# Patient Record
Sex: Male | Born: 1965 | Race: Black or African American | Hispanic: No | Marital: Married | Smoking: Former smoker
Health system: Southern US, Community
[De-identification: ages and names within clinical notes are randomized; demographics above are authoritative.]

## PROBLEM LIST (undated history)

## (undated) DIAGNOSIS — I10 Essential (primary) hypertension: Secondary | ICD-10-CM

## (undated) DIAGNOSIS — F419 Anxiety disorder, unspecified: Secondary | ICD-10-CM

## (undated) DIAGNOSIS — D75839 Thrombocytosis, unspecified: Secondary | ICD-10-CM

## (undated) DIAGNOSIS — E785 Hyperlipidemia, unspecified: Secondary | ICD-10-CM

## (undated) DIAGNOSIS — D473 Essential (hemorrhagic) thrombocythemia: Secondary | ICD-10-CM

## (undated) HISTORY — DX: Thrombocytosis, unspecified: D75.839

## (undated) HISTORY — DX: Hyperlipidemia, unspecified: E78.5

## (undated) HISTORY — DX: Essential (hemorrhagic) thrombocythemia: D47.3

## (undated) HISTORY — DX: Anxiety disorder, unspecified: F41.9

---

## 2011-09-15 ENCOUNTER — Other Ambulatory Visit: Payer: Self-pay | Admitting: Internal Medicine

## 2016-03-13 ENCOUNTER — Emergency Department (HOSPITAL_COMMUNITY): Payer: Worker's Compensation

## 2016-03-13 ENCOUNTER — Emergency Department (HOSPITAL_COMMUNITY)
Admission: EM | Admit: 2016-03-13 | Discharge: 2016-03-13 | Disposition: A | Discharge status: Home / Self Care | Payer: Worker's Compensation | Attending: Emergency Medicine | Admitting: Emergency Medicine

## 2016-03-13 ENCOUNTER — Encounter (HOSPITAL_COMMUNITY): Payer: Self-pay

## 2016-03-13 DIAGNOSIS — Z23 Encounter for immunization: Secondary | ICD-10-CM | POA: Diagnosis not present

## 2016-03-13 DIAGNOSIS — Y999 Unspecified external cause status: Secondary | ICD-10-CM | POA: Diagnosis not present

## 2016-03-13 DIAGNOSIS — Y92410 Unspecified street and highway as the place of occurrence of the external cause: Secondary | ICD-10-CM | POA: Insufficient documentation

## 2016-03-13 DIAGNOSIS — Y939 Activity, unspecified: Secondary | ICD-10-CM | POA: Insufficient documentation

## 2016-03-13 DIAGNOSIS — S0181XA Laceration without foreign body of other part of head, initial encounter: Secondary | ICD-10-CM | POA: Insufficient documentation

## 2016-03-13 DIAGNOSIS — R0781 Pleurodynia: Secondary | ICD-10-CM | POA: Diagnosis not present

## 2016-03-13 DIAGNOSIS — R109 Unspecified abdominal pain: Secondary | ICD-10-CM | POA: Diagnosis not present

## 2016-03-13 DIAGNOSIS — S0990XA Unspecified injury of head, initial encounter: Secondary | ICD-10-CM | POA: Diagnosis present

## 2016-03-13 DIAGNOSIS — S20312A Abrasion of left front wall of thorax, initial encounter: Secondary | ICD-10-CM | POA: Diagnosis not present

## 2016-03-13 DIAGNOSIS — S60222A Contusion of left hand, initial encounter: Secondary | ICD-10-CM | POA: Diagnosis not present

## 2016-03-13 DIAGNOSIS — S5002XA Contusion of left elbow, initial encounter: Secondary | ICD-10-CM | POA: Insufficient documentation

## 2016-03-13 DIAGNOSIS — R1012 Left upper quadrant pain: Secondary | ICD-10-CM

## 2016-03-13 DIAGNOSIS — T07XXXA Unspecified multiple injuries, initial encounter: Secondary | ICD-10-CM

## 2016-03-13 DIAGNOSIS — S30811A Abrasion of abdominal wall, initial encounter: Secondary | ICD-10-CM | POA: Diagnosis not present

## 2016-03-13 DIAGNOSIS — T1490XA Injury, unspecified, initial encounter: Secondary | ICD-10-CM

## 2016-03-13 DIAGNOSIS — I10 Essential (primary) hypertension: Secondary | ICD-10-CM | POA: Insufficient documentation

## 2016-03-13 HISTORY — DX: Essential (primary) hypertension: I10

## 2016-03-13 LAB — CBC WITH DIFFERENTIAL/PLATELET
Basophils Absolute: 0 10*3/uL (ref 0.0–0.1)
Basophils Relative: 0 %
Eosinophils Absolute: 0.2 10*3/uL (ref 0.0–0.7)
Eosinophils Relative: 2 %
HCT: 42.5 % (ref 39.0–52.0)
Hemoglobin: 13.2 g/dL (ref 13.0–17.0)
Lymphocytes Relative: 21 %
Lymphs Abs: 1.9 10*3/uL (ref 0.7–4.0)
MCH: 25.5 pg — ABNORMAL LOW (ref 26.0–34.0)
MCHC: 31.1 g/dL (ref 30.0–36.0)
MCV: 82.2 fL (ref 78.0–100.0)
Monocytes Absolute: 0.8 10*3/uL (ref 0.1–1.0)
Monocytes Relative: 9 %
Neutro Abs: 5.8 10*3/uL (ref 1.7–7.7)
Neutrophils Relative %: 68 %
Platelets: 812 10*3/uL — ABNORMAL HIGH (ref 150–400)
RBC: 5.17 MIL/uL (ref 4.22–5.81)
RDW: 15.3 % (ref 11.5–15.5)
WBC: 8.7 10*3/uL (ref 4.0–10.5)

## 2016-03-13 LAB — BASIC METABOLIC PANEL
Anion gap: 6 (ref 5–15)
BUN: 11 mg/dL (ref 6–20)
CO2: 27 mmol/L (ref 22–32)
Calcium: 8.7 mg/dL — ABNORMAL LOW (ref 8.9–10.3)
Chloride: 104 mmol/L (ref 101–111)
Creatinine, Ser: 1.21 mg/dL (ref 0.61–1.24)
GFR calc Af Amer: >60 mL/min (ref >60)
GFR calc non Af Amer: >60 mL/min (ref >60)
Glucose, Bld: 97 mg/dL (ref 65–99)
Potassium: 3.9 mmol/L (ref 3.5–5.1)
Sodium: 137 mmol/L (ref 135–145)

## 2016-03-13 MED ORDER — FLUORESCEIN SODIUM 1 MG OP STRP
1.0000 | ORAL_STRIP | Freq: Once | OPHTHALMIC | Status: AC
  Administered 2016-03-13: 1 via OPHTHALMIC
  Filled 2016-03-13: qty 1

## 2016-03-13 MED ORDER — CYCLOBENZAPRINE HCL 10 MG PO TABS: 10.0000 mg | ORAL_TABLET | Freq: Three times a day (TID) | ORAL | 0 refills | Status: DC | PRN

## 2016-03-13 MED ORDER — LIDOCAINE-EPINEPHRINE (PF) 2 %-1:200000 IJ SOLN
20.0000 mL | Freq: Once | INTRAMUSCULAR | Status: AC
  Administered 2016-03-13: 20 mL
  Filled 2016-03-13: qty 20

## 2016-03-13 MED ORDER — IOPAMIDOL (ISOVUE-300) INJECTION 61%
INTRAVENOUS | Status: AC
  Administered 2016-03-13: 100 mL
  Filled 2016-03-13: qty 75

## 2016-03-13 MED ORDER — IBUPROFEN 800 MG PO TABS: 800.0000 mg | ORAL_TABLET | Freq: Three times a day (TID) | ORAL | 0 refills | Status: DC | PRN

## 2016-03-13 MED ORDER — IOPAMIDOL (ISOVUE-300) INJECTION 61%
INTRAVENOUS | Status: AC
  Filled 2016-03-13: qty 50

## 2016-03-13 MED ORDER — TETRACAINE HCL 0.5 % OP SOLN
2.0000 [drp] | Freq: Once | OPHTHALMIC | Status: AC
  Administered 2016-03-13: 2 [drp] via OPHTHALMIC
  Filled 2016-03-13: qty 2

## 2016-03-13 MED ORDER — HYDROCODONE-ACETAMINOPHEN 5-325 MG PO TABS: 1.0000 | ORAL_TABLET | ORAL | 0 refills | Status: DC | PRN

## 2016-03-13 MED ORDER — TETANUS-DIPHTH-ACELL PERTUSSIS 5-2.5-18.5 LF-MCG/0.5 IM SUSP
0.5000 mL | Freq: Once | INTRAMUSCULAR | Status: AC
  Administered 2016-03-13: 0.5 mL via INTRAMUSCULAR
  Filled 2016-03-13: qty 0.5

## 2016-03-13 MED ORDER — BACITRACIN ZINC 500 UNIT/GM EX OINT: 1.0000 "application " | TOPICAL_OINTMENT | Freq: Two times a day (BID) | CUTANEOUS | 0 refills | Status: DC

## 2016-03-13 MED ORDER — BACITRACIN ZINC 500 UNIT/GM EX OINT: TOPICAL_OINTMENT | Freq: Two times a day (BID) | CUTANEOUS | Status: DC

## 2016-03-13 MED ORDER — HYDROMORPHONE HCL 1 MG/ML IJ SOLN
1.0000 mg | Freq: Once | INTRAMUSCULAR | Status: AC
  Administered 2016-03-13: 1 mg via INTRAVENOUS
  Filled 2016-03-13: qty 1

## 2016-03-13 NOTE — Discharge Instructions (Signed)
Read the information below.  Use the prescribed medication as directed.  Please discuss all new medications with your pharmacist.  Do not take additional tylenol while taking the prescribed pain medication to avoid overdose.  You may return to the Emergency Department at any time for worsening condition or any new symptoms that concern you.    If you develop redness, swelling, pus draining from the wound, or fevers greater than 100.4, return to the ER immediately for a recheck.     You have had a head injury which does not appear to require admission at this time. A concussion is a state of changed mental ability from trauma. SEEK IMMEDIATE MEDICAL ATTENTION IF: There is confusion or drowsiness (although children frequently become drowsy after injury).  You cannot awaken the injured person.  There is nausea (feeling sick to your stomach) or continued, forceful vomiting.  You notice dizziness or unsteadiness which is getting worse, or inability to walk.  You have convulsions or unconsciousness.  You experience severe, persistent headaches not relieved by Tylenol?. (Do not take aspirin as this impairs clotting abilities). Take other pain medications only as directed.  You cannot use arms or legs normally.  There are changes in pupil sizes. (This is the black center in the colored part of the eye)  There is clear or bloody discharge from the nose or ears.  Change in speech, vision, swallowing, or understanding.  Localized weakness, numbness, tingling, or change in bowel or bladder control.
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
## 2016-03-17 ENCOUNTER — Encounter: Payer: Self-pay | Admitting: Hematology and Oncology

## 2016-03-17 ENCOUNTER — Telehealth: Payer: Self-pay | Admitting: Hematology and Oncology

## 2016-03-17 NOTE — Telephone Encounter (Signed)
Telephone call from Ethelene Browns from Dr. Lina Sar office.  Appointment scheduled with [facility] on 8/17 at 345pm. Letter mailed to the patient.

## 2016-03-30 ENCOUNTER — Encounter: Payer: Self-pay | Admitting: Hematology and Oncology

## 2016-03-30 ENCOUNTER — Ambulatory Visit (HOSPITAL_BASED_OUTPATIENT_CLINIC_OR_DEPARTMENT_OTHER): Payer: 59 | Admitting: Hematology and Oncology

## 2016-03-30 ENCOUNTER — Telehealth: Payer: Self-pay | Admitting: Hematology and Oncology

## 2016-03-30 ENCOUNTER — Ambulatory Visit (HOSPITAL_BASED_OUTPATIENT_CLINIC_OR_DEPARTMENT_OTHER): Payer: 59

## 2016-03-30 VITALS — BP 149/103 | HR 55 | Temp 98.4°F | Resp 19 | Wt 228.7 lb

## 2016-03-30 DIAGNOSIS — D75839 Thrombocytosis, unspecified: Secondary | ICD-10-CM

## 2016-03-30 DIAGNOSIS — Z1589 Genetic susceptibility to other disease: Secondary | ICD-10-CM | POA: Insufficient documentation

## 2016-03-30 DIAGNOSIS — D473 Essential (hemorrhagic) thrombocythemia: Secondary | ICD-10-CM

## 2016-03-30 LAB — CBC WITH DIFFERENTIAL/PLATELET
BASO%: 1 % (ref 0.0–2.0)
Basophils Absolute: 0.1 10*3/uL (ref 0.0–0.1)
EOS%: 2.1 % (ref 0.0–7.0)
Eosinophils Absolute: 0.1 10*3/uL (ref 0.0–0.5)
HCT: 46.7 % (ref 38.4–49.9)
HGB: 14.9 g/dL (ref 13.0–17.1)
LYMPH%: 36.3 % (ref 14.0–49.0)
MCH: 25.6 pg — ABNORMAL LOW (ref 27.2–33.4)
MCHC: 32 g/dL (ref 32.0–36.0)
MCV: 80.1 fL (ref 79.3–98.0)
MONO#: 0.6 10*3/uL (ref 0.1–0.9)
MONO%: 8.9 % (ref 0.0–14.0)
NEUT#: 3.7 10*3/uL (ref 1.5–6.5)
NEUT%: 51.7 % (ref 39.0–75.0)
Platelets: 958 10*3/uL — ABNORMAL HIGH (ref 140–400)
RBC: 5.82 10*6/uL (ref 4.20–5.82)
RDW: 15.8 % — ABNORMAL HIGH (ref 11.0–14.6)
WBC: 7.2 10*3/uL (ref 4.0–10.3)
lymph#: 2.6 10*3/uL (ref 0.9–3.3)

## 2016-03-30 LAB — FERRITIN: Ferritin: 108 ng/ml (ref 22–316)

## 2016-03-30 LAB — IRON AND TIBC
%SAT: 54 % (ref 20–55)
Iron: 146 ug/dL (ref 42–163)
TIBC: 270 ug/dL (ref 202–409)
UIBC: 125 ug/dL (ref 117–376)

## 2016-03-30 NOTE — Telephone Encounter (Signed)
appt made and avs printed °
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
## 2016-03-30 NOTE — Assessment & Plan Note (Signed)
Platelet counts 810 K to 950K  Differential diagnosis 1. Primary thrombocytosis: Related to myeloproliferative disorders of the bone marrow especially essential thrombocytosis and CML. I would like to send for BCR-ABL as well as JAK-2 dictation testings. Patient understands that JAK2 mutation is only present in 50% of essential thrombocytosis so the test is advantageous only if it is positive. If it is negative, it does not rule out. 2. Secondary/reactive thrombocytosis Different causes including infections, inflammation, iron deficiency.  I would like to send out for C-reactive protein, iron studies with ferritin to complete the workup.  Treatment options: 1. If it is primary essential thrombocytosis, treatment would depend on platelet count level as well as history of thrombosis. A. For low risk patients, (platelet counts less than 1000 and no history of blood clots) the treatment would be with aspirin therapy B. for high risk patients(platelet counts greater than 1000/history of blood clot) the treatment would be platelet lowering therapy with aspirin 2. Treatment of secondary thrombocytosis would be to treat underlying cause. There would not be any risk of thrombosis with secondary thrombocytosis.  Return to clinic in 2 weeks to discuss the results of these tests.

## 2016-03-31 LAB — C-REACTIVE PROTEIN: CRP: 0.8 mg/L (ref 0.0–4.9)

## 2016-04-13 ENCOUNTER — Ambulatory Visit: Payer: 59 | Admitting: Hematology and Oncology

## 2016-04-13 NOTE — Assessment & Plan Note (Deleted)
Platelet counts 810 K to 950K  Differential diagnosis 1. Primary thrombocytosis: Related to myeloproliferative disorders of the bone marrow especially essential thrombocytosis and CML. 2. Secondary/reactive thrombocytosis  Lab work review: From 03/30/2016 1. CRP 0.8 normal 2. platelet count 958 3. Iron studies show ferritin of 108 and iron saturation of 54% 4. JAK-2 Mutation 5. BCR-ABL:   Recommendation: 1. Aspirin 2. platelet lowering therapy with anagrelide  Return to clinic in 2 months to check the response to treatment. Our goal is to decrease the platelet count to <450 K

## 2016-06-20 ENCOUNTER — Encounter (HOSPITAL_COMMUNITY): Payer: Self-pay

## 2016-06-20 ENCOUNTER — Emergency Department (HOSPITAL_COMMUNITY)
Admission: EM | Admit: 2016-06-20 | Discharge: 2016-06-20 | Disposition: A | Discharge status: Home / Self Care | Payer: 59 | Attending: Dermatology | Admitting: Dermatology

## 2016-06-20 DIAGNOSIS — M79644 Pain in right finger(s): Secondary | ICD-10-CM | POA: Insufficient documentation

## 2016-06-20 DIAGNOSIS — I10 Essential (primary) hypertension: Secondary | ICD-10-CM | POA: Diagnosis not present

## 2016-06-20 DIAGNOSIS — Z5321 Procedure and treatment not carried out due to patient leaving prior to being seen by health care provider: Secondary | ICD-10-CM | POA: Insufficient documentation

## 2016-06-20 NOTE — ED Triage Notes (Signed)
Pt complaining of R 2nd finger pain. Pt states new onset darkening of tip of finger. Pt complaining of throbbing pain. Pt denies any injury/trauma.

## 2016-06-20 NOTE — ED Notes (Signed)
Pt states does not want to wait. Pt states wants to leave. RN encouraged pt to return if worsening symptoms.

## 2016-10-19 ENCOUNTER — Ambulatory Visit: Payer: Self-pay | Admitting: Adult Health

## 2016-11-24 ENCOUNTER — Ambulatory Visit (INDEPENDENT_AMBULATORY_CARE_PROVIDER_SITE_OTHER): Payer: Self-pay | Admitting: Urgent Care

## 2016-11-24 VITALS — BP 150/94 | HR 86 | Temp 97.6°F | Resp 18 | Ht 74.5 in | Wt 229.0 lb

## 2016-11-24 DIAGNOSIS — Z024 Encounter for examination for driving license: Secondary | ICD-10-CM

## 2016-11-24 DIAGNOSIS — I10 Essential (primary) hypertension: Secondary | ICD-10-CM

## 2016-11-24 DIAGNOSIS — R030 Elevated blood-pressure reading, without diagnosis of hypertension: Secondary | ICD-10-CM

## 2016-11-24 NOTE — Progress Notes (Signed)
  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 The patient reports history of HTN, managed with lis-HCTZ. Has a history of hand infection from fishing, is w/o sequelae. Denies dizziness, chronic headache, blurred vision, chest pain, shortness of breath, heart racing, palpitations, nausea, vomiting, abdominal pain, hematuria, lower leg swelling. Denies smoking cigarettes or drinking alcohol.   The following portions of the patient's history were reviewed and updated as appropriate: allergies, current medications, past family history, past medical history, past social history and past surgical history.  Objective:   BP (!) 150/94 (BP Location: Left Arm, Patient Position: Sitting, Cuff Size: Normal)   Pulse 86   Temp 97.6 F (36.4 C) (Oral)   Resp 18   Ht 6' 2.5" (1.892 m)   Wt 229 lb (103.9 kg)   SpO2 97%   BMI 29.01 kg/m   Vision/hearing:  Visual Acuity Screening   Right eye Left eye Both eyes  Without correction: 20/20 20/20 20/20   With correction:     Hearing Screening Comments: Peripheral Vision: Right eye 85 degrees. Left eye 85 degrees. The patient can distinguish the colors red, amber and green. The patient was able to hear a forced whisper from L=10 R=10 feet.  Patient can recognize and distinguish among traffic control signals and devices showing standard red, green, and amber colors.  Corrective lenses required: No  Monocular Vision?: No  Hearing aid requirement: No  Physical Exam  Constitutional: He is oriented to person, place, and time. He appears well-developed and well-nourished.  HENT:  TM's intact bilaterally, no effusions or erythema. Nasal turbinates pink and moist, nasal passages patent. No sinus tenderness. Oropharynx clear, mucous membranes moist, dentition in good repair.  Eyes: Conjunctivae and EOM are normal. Pupils are equal, round, and reactive to light. Right eye exhibits no  discharge. Left eye exhibits no discharge. No scleral icterus.  Neck: Normal range of motion. Neck supple.  Cardiovascular: Normal rate, regular rhythm and intact distal pulses.  Exam reveals no gallop and no friction rub.   No murmur heard. Pulmonary/Chest: No stridor. No respiratory distress. He has no wheezes. He has no rales.  Abdominal: Soft. Bowel sounds are normal. He exhibits no distension and no mass. There is no tenderness.  Musculoskeletal: Normal range of motion. He exhibits no edema or tenderness.  Lymphadenopathy:    He has no cervical adenopathy.  Neurological: He is alert and oriented to person, place, and time. He has normal reflexes. He displays normal reflexes. Coordination normal.  Skin: Skin is warm and dry. Capillary refill takes less than 2 seconds. No rash noted. No erythema. No pallor.  Psychiatric: He has a normal mood and affect.   Labs: Comments: spgr:1.020 Glu:neg, Pro:neg, Blood:neg   Assessment:    Healthy male exam.  Meets standards, but periodic monitoring required due to HTN.  Driver qualified only for 3 months.    Plan:   Need follow-up in 3 months for recheck of HTN. Return as needed.  Jaynee Eagles, PA-C [facility] [phone] 11/24/2016  2:59 PM

## 2016-11-24 NOTE — Patient Instructions (Signed)
     IF you received an x-ray today, you will receive an invoice from [facility]. Please contact [facility] at [phone] with questions or concerns regarding your invoice.   IF you received labwork today, you will receive an invoice from [facility]. Please contact [facility] at [phone] with questions or concerns regarding your invoice.   Our billing staff will not be able to assist you with questions regarding bills from these companies.  You will be contacted with the lab results as soon as they are available. The fastest way to get your results is to activate your My Chart account. Instructions are located on the last page of this paperwork. If you have not heard from us regarding the results in 2 weeks, please contact this office.     

## 2016-12-06 ENCOUNTER — Ambulatory Visit (INDEPENDENT_AMBULATORY_CARE_PROVIDER_SITE_OTHER): Payer: 59 | Admitting: Emergency Medicine

## 2016-12-06 VITALS — BP 144/78 | HR 68 | Temp 97.8°F | Resp 18 | Ht 74.0 in | Wt 234.0 lb

## 2016-12-06 DIAGNOSIS — D473 Essential (hemorrhagic) thrombocythemia: Secondary | ICD-10-CM

## 2016-12-06 DIAGNOSIS — Z0000 Encounter for general adult medical examination without abnormal findings: Secondary | ICD-10-CM

## 2016-12-06 DIAGNOSIS — I10 Essential (primary) hypertension: Secondary | ICD-10-CM | POA: Insufficient documentation

## 2016-12-06 MED ORDER — LISINOPRIL-HYDROCHLOROTHIAZIDE 10-12.5 MG PO TABS: ORAL_TABLET | ORAL | 3 refills | Status: DC

## 2016-12-06 NOTE — Progress Notes (Signed)
Stephen Patrick 51 y.o.   Chief Complaint  Patient presents with  . Annual Exam  . Medication Refill    Lisinopril HCTZ    HISTORY OF PRESENT ILLNESS: This is a 51 y.o. male here for annual exam; needs med refill for HTN.  HPI   Prior to Admission medications   Medication Sig Start Date End Date Taking? Authorizing Provider  lisinopril-hydrochlorothiazide (PRINZIDE,ZESTORETIC) 10-12.5 MG per tablet take 1 tablet by mouth once daily ; NEEDS OFFICE VISIT 09/15/11  Yes Rise Mu, PA-C    No Known Allergies  Patient Active Problem List   Diagnosis Date Noted  . Thrombocytosis ([place]) 03/30/2016    Past Medical History:  Diagnosis Date  . Hypertension     No past surgical history on file.  Social History   Social History  . Marital status: Married    Spouse name: N/A  . Number of children: N/A  . Years of education: N/A   Occupational History  . Not on file.   Social History Main Topics  . Smoking status: Never Smoker  . Smokeless tobacco: Never Used  . Alcohol use No  . Drug use: No  . Sexual activity: No   Other Topics Concern  . Not on file   Social History Narrative  . No narrative on file    Family History  Problem Relation Age of Onset  . Cancer Father      Review of Systems  Constitutional: Negative.  Negative for chills, fever and weight loss.  HENT: Negative.  Negative for congestion, nosebleeds and sore throat.   Eyes: Negative.  Negative for blurred vision, double vision, discharge and redness.  Respiratory: Negative.  Negative for cough, shortness of breath and wheezing.   Cardiovascular: Negative.  Negative for chest pain, palpitations, claudication, leg swelling and PND.  Gastrointestinal: Negative.  Negative for abdominal pain, blood in stool, diarrhea, nausea and vomiting.  Genitourinary: Negative.  Negative for dysuria and hematuria.  Musculoskeletal: Negative.  Negative for back pain and joint pain.  Skin: Negative.  Negative for  rash.  Neurological: Negative.  Negative for dizziness, sensory change, focal weakness, seizures, loss of consciousness and headaches.  Endo/Heme/Allergies: Negative.  Does not bruise/bleed easily.  All other systems reviewed and are negative.  Vitals:   12/06/16 0824 12/06/16 0834  BP: (!) 162/92 (!) 144/78  Pulse:    Resp:    Temp:       Physical Exam  Constitutional: He is oriented to person, place, and time. He appears well-developed and well-nourished.  HENT:  Head: Normocephalic and atraumatic.  Nose: Nose normal.  Mouth/Throat: Oropharynx is clear and moist. No oropharyngeal exudate.  Eyes: Conjunctivae and EOM are normal. Pupils are equal, round, and reactive to light.  Neck: Normal range of motion. Neck supple. No JVD present. No thyromegaly present.  Cardiovascular: Normal rate, regular rhythm, normal heart sounds and intact distal pulses.   Pulmonary/Chest: Effort normal and breath sounds normal.  Abdominal: Soft. Bowel sounds are normal. He exhibits no distension. There is no tenderness.  Musculoskeletal: Normal range of motion.  Lymphadenopathy:    He has no cervical adenopathy.  Neurological: He is alert and oriented to person, place, and time. No sensory deficit. He exhibits normal muscle tone.  Skin: Skin is warm and dry. Capillary refill takes less than 2 seconds. No rash noted.  Psychiatric: He has a normal mood and affect. His behavior is normal.  Vitals reviewed.    ASSESSMENT & PLAN: Stephen Patrick was  seen today for annual exam and medication refill.  Diagnoses and all orders for this visit:  Routine general medical examination at a health care facility -     CBC with Differential -     Comprehensive metabolic panel -     Hemoglobin A1c -     Lipid panel -     PSA(Must document that pt has been informed of limitations of PSA testing.) -     TSH -     Hepatitis C antibody screen -     HIV antibody  Essential hypertension  Primary thrombocytosis  (HCC)  Other orders -     Discontinue: lisinopril-hydrochlorothiazide (PRINZIDE,ZESTORETIC) 10-12.5 MG tablet; take 1 tablet by mouth once daily -     lisinopril-hydrochlorothiazide (PRINZIDE,ZESTORETIC) 10-12.5 MG tablet; take 1 tablet by mouth once daily    Patient Instructions       IF you received an x-ray today, you will receive an invoice from [facility]. Please contact [facility] at [phone] with questions or concerns regarding your invoice.   IF you received labwork today, you will receive an invoice from [place]. Please contact [facility] at [phone] with questions or concerns regarding your invoice.   Our billing staff will not be able to assist you with questions regarding bills from these companies.  You will be contacted with the lab results as soon as they are available. The fastest way to get your results is to activate your My Chart account. Instructions are located on the last page of this paperwork. If you have not heard from [place] regarding the results in 2 weeks, please contact this office.        Health Maintenance, Male A healthy lifestyle and preventive care is important for your health and wellness. Ask your health care provider about what schedule of regular examinations is right for you. What should I know about weight and diet?  Eat a Healthy Diet  Eat plenty of vegetables, fruits, whole grains, low-fat dairy products, and lean protein.  Do not eat a lot of foods high in solid fats, added sugars, or salt. Maintain a Healthy Weight  Regular exercise can help you achieve or maintain a healthy weight. You should:  Do at least 150 minutes of exercise each week. The exercise should increase your heart rate and make you sweat (moderate-intensity exercise).  Do strength-training exercises at least twice a week. Watch Your Levels of Cholesterol and Blood Lipids  Have your blood tested for lipids and cholesterol every 5 years  starting at 53 years of age. If you are at high risk for heart disease, you should start having your blood tested when you are 53 years old. You may need to have your cholesterol levels checked more often if:  Your lipid or cholesterol levels are high.  You are older than 53 years of age.  You are at high risk for heart disease. What should I know about cancer screening? Many types of cancers can be detected early and may often be prevented. Lung Cancer  You should be screened every year for lung cancer if:  You are a current smoker who has smoked for at least 30 years.  You are a former smoker who has quit within the past 15 years.  Talk to your health care provider about your screening options, when you should start screening, and how often you should be screened. Colorectal Cancer  Routine colorectal cancer screening usually begins at 53 years of age and should be repeated  every 5-10 years until you are 53 years old. You may need to be screened more often if early forms of precancerous polyps or small growths are found. Your health care provider may recommend screening at an earlier age if you have risk factors for colon cancer.  Your health care provider may recommend using home test kits to check for hidden blood in the stool.  A small camera at the end of a tube can be used to examine your colon (sigmoidoscopy or colonoscopy). This checks for the earliest forms of colorectal cancer. Prostate and Testicular Cancer  Depending on your age and overall health, your health care provider may do certain tests to screen for prostate and testicular cancer.  Talk to your health care provider about any symptoms or concerns you have about testicular or prostate cancer. Skin Cancer  Check your skin from head to toe regularly.  Tell your health care provider about any new moles or changes in moles, especially if:  There is a change in a mole's size, shape, or color.  You have a mole that  is larger than a pencil eraser.  Always use sunscreen. Apply sunscreen liberally and repeat throughout the day.  Protect yourself by wearing long sleeves, pants, a wide-brimmed hat, and sunglasses when outside. What should I know about heart disease, diabetes, and high blood pressure?  If you are 40-28 years of age, have your blood pressure checked every 3-5 years. If you are 19 years of age or older, have your blood pressure checked every year. You should have your blood pressure measured twice-once when you are at a hospital or clinic, and once when you are not at a hospital or clinic. Record the average of the two measurements. To check your blood pressure when you are not at a hospital or clinic, you can use:  An automated blood pressure machine at a pharmacy.  A home blood pressure monitor.  Talk to your health care provider about your target blood pressure.  If you are between 58-65 years old, ask your health care provider if you should take aspirin to prevent heart disease.  Have regular diabetes screenings by checking your fasting blood sugar level.  If you are at a normal weight and have a low risk for diabetes, have this test once every three years after the age of 42.  If you are overweight and have a high risk for diabetes, consider being tested at a younger age or more often.  A one-time screening for abdominal aortic aneurysm (AAA) by ultrasound is recommended for men aged 98-75 years who are current or former smokers. What should I know about preventing infection? Hepatitis B  If you have a higher risk for hepatitis B, you should be screened for this virus. Talk with your health care provider to find out if you are at risk for hepatitis B infection. Hepatitis C  Blood testing is recommended for:  Everyone born from [birth year] through 1967.  Anyone with known risk factors for hepatitis C. Sexually Transmitted Diseases (STDs)  You should be screened each year for STDs  including gonorrhea and chlamydia if:  You are sexually active and are younger than 53 years of age.  You are older than 53 years of age and your health care provider tells you that you are at risk for this type of infection.  Your sexual activity has changed since you were last screened and you are at an increased risk for chlamydia or gonorrhea. Ask your health  care provider if you are at risk.  Talk with your health care provider about whether you are at high risk of being infected with HIV. Your health care provider may recommend a prescription medicine to help prevent HIV infection. What else can I do?  Schedule regular health, dental, and eye exams.  Stay current with your vaccines (immunizations).  Do not use any tobacco products, such as cigarettes, chewing tobacco, and e-cigarettes. If you need help quitting, ask your health care provider.  Limit alcohol intake to no more than 2 drinks per day. One drink equals 12 ounces of beer, 5 ounces of wine, or 1 ounces of hard liquor.  Do not use street drugs.  Do not share needles.  Ask your health care provider for help if you need support or information about quitting drugs.  Tell your health care provider if you often feel depressed.  Tell your health care provider if you have ever been abused or do not feel safe at home. This information is not intended to replace advice given to you by your health care provider. Make sure you discuss any questions you have with your health care provider. Document Released: 01/27/2008 Document Revised: 03/29/2016 Document Reviewed: 05/04/2015 Elsevier Interactive Patient Education  2017 [facility]) Exercise Recommendation  Being physically active is important to prevent heart disease and stroke, the nation's No. 1and No. 5killers. To improve overall cardiovascular health, we suggest at least 150 minutes per week of moderate exercise or 75 minutes per week of  vigorous exercise (or a combination of moderate and vigorous activity). Thirty minutes a day, five times a week is an easy goal to remember. You will also experience benefits even if you divide your time into two or three segments of 10 to 15 minutes per day.  For people who would benefit from lowering their blood pressure or cholesterol, we recommend 40 minutes of aerobic exercise of moderate to vigorous intensity three to four times a week to lower the risk for heart attack and stroke.  Physical activity is anything that makes you move your body and burn calories.  This includes things like climbing stairs or playing sports. Aerobic exercises benefit your heart, and include walking, jogging, swimming or biking. Strength and stretching exercises are best for overall stamina and flexibility.  The simplest, positive change you can make to effectively improve your heart health is to start walking. It's enjoyable, free, easy, social and great exercise. A walking program is flexible and boasts high success rates because people can stick with it. It's easy for walking to become a regular and satisfying part of life.   For Overall Cardiovascular Health:  At least 30 minutes of moderate-intensity aerobic activity at least 5 days per week for a total of 150  OR   At least 25 minutes of vigorous aerobic activity at least 3 days per week for a total of 75 minutes; or a combination of moderate- and vigorous-intensity aerobic activity  AND   Moderate- to high-intensity muscle-strengthening activity at least 2 days per week for additional health benefits.  For Lowering Blood Pressure and Cholesterol  An average 40 minutes of moderate- to vigorous-intensity aerobic activity 3 or 4 times per week  What if I can't make it to the time goal? Something is always better than nothing! And everyone has to start somewhere. Even if you've been sedentary for years, today is the day you can begin to make healthy  changes in your  life. If you don't think you'll make it for 30 or 40 minutes, set a reachable goal for today. You can work up toward your overall goal by increasing your time as you get stronger. Don't let all-or-nothing thinking rob you of doing what you can every day.  Source:http://www.heart.org    Hypertension Hypertension is another name for high blood pressure. High blood pressure forces your heart to work harder to pump blood. This can cause problems over time. There are two numbers in a blood pressure reading. There is a top number (systolic) over a bottom number (diastolic). It is best to have a blood pressure below 120/80. Healthy choices can help lower your blood pressure. You may need medicine to help lower your blood pressure if:  Your blood pressure cannot be lowered with healthy choices.  Your blood pressure is higher than 130/80. Follow these instructions at home: Eating and drinking   If directed, follow the DASH eating plan. This diet includes:  Filling half of your plate at each meal with fruits and vegetables.  Filling one quarter of your plate at each meal with whole grains. Whole grains include whole wheat pasta, brown rice, and whole grain bread.  Eating or drinking low-fat dairy products, such as skim milk or low-fat yogurt.  Filling one quarter of your plate at each meal with low-fat (lean) proteins. Low-fat proteins include fish, skinless chicken, eggs, beans, and tofu.  Avoiding fatty meat, cured and processed meat, or chicken with skin.  Avoiding premade or processed food.  Eat less than 1,500 mg of salt (sodium) a day.  Limit alcohol use to no more than 1 drink a day for nonpregnant women and 2 drinks a day for men. One drink equals 12 oz of beer, 5 oz of wine, or 1 oz of hard liquor. Lifestyle   Work with your doctor to stay at a healthy weight or to lose weight. Ask your doctor what the best weight is for you.  Get at least 30 minutes of exercise  that causes your heart to beat faster (aerobic exercise) most days of the week. This may include walking, swimming, or biking.  Get at least 30 minutes of exercise that strengthens your muscles (resistance exercise) at least 3 days a week. This may include lifting weights or pilates.  Do not use any products that contain nicotine or tobacco. This includes cigarettes and e-cigarettes. If you need help quitting, ask your doctor.  Check your blood pressure at home as told by your doctor.  Keep all follow-up visits as told by your doctor. This is important. Medicines   Take over-the-counter and prescription medicines only as told by your doctor. Follow directions carefully.  Do not skip doses of blood pressure medicine. The medicine does not work as well if you skip doses. Skipping doses also puts you at risk for problems.  Ask your doctor about side effects or reactions to medicines that you should watch for. Contact a doctor if:  You think you are having a reaction to the medicine you are taking.  You have headaches that keep coming back (recurring).  You feel dizzy.  You have swelling in your ankles.  You have trouble with your vision. Get help right away if:  You get a very bad headache.  You start to feel confused.  You feel weak or numb.  You feel faint.  You get very bad pain in your:  Chest.  Belly (abdomen).  You throw up (vomit) more than once.  You have trouble breathing. Summary  Hypertension is another name for high blood pressure.  Making healthy choices can help lower blood pressure. If your blood pressure cannot be controlled with healthy choices, you may need to take medicine. This information is not intended to replace advice given to you by your health care provider. Make sure you discuss any questions you have with your health care provider. Document Released: 01/17/2008 Document Revised: 06/28/2016 Document Reviewed: 06/28/2016 Elsevier  Interactive Patient Education  2017 [employer]      Agustina Caroli, MD [facility]

## 2016-12-06 NOTE — Patient Instructions (Addendum)
   IF you received an x-ray today, you will receive an invoice from [facility]. Please contact [facility] at [phone] with questions or concerns regarding your invoice.   IF you received labwork today, you will receive an invoice from [facility]. Please contact [facility] at [phone] with questions or concerns regarding your invoice.   Our billing staff will not be able to assist you with questions regarding bills from these companies.  You will be contacted with the lab results as soon as they are available. The fastest way to get your results is to activate your My Chart account. Instructions are located on the last page of this paperwork. If you have not heard from us regarding the results in 2 weeks, please contact this office.        Health Maintenance, Male A healthy lifestyle and preventive care is important for your health and wellness. Ask your health care provider about what schedule of regular examinations is right for you. What should I know about weight and diet?  Eat a Healthy Diet  Eat plenty of vegetables, fruits, whole grains, low-fat dairy products, and lean protein.  Do not eat a lot of foods high in solid fats, added sugars, or salt. Maintain a Healthy Weight  Regular exercise can help you achieve or maintain a healthy weight. You should:  Do at least 150 minutes of exercise each week. The exercise should increase your heart rate and make you sweat (moderate-intensity exercise).  Do strength-training exercises at least twice a week. Watch Your Levels of Cholesterol and Blood Lipids  Have your blood tested for lipids and cholesterol every 5 years starting at 53 years of age. If you are at high risk for heart disease, you should start having your blood tested when you are 53 years old. You may need to have your cholesterol levels checked more often if:  Your lipid or cholesterol levels are high.  You are older than 53 years of  age.  You are at high risk for heart disease. What should I know about cancer screening? Many types of cancers can be detected early and may often be prevented. Lung Cancer  You should be screened every year for lung cancer if:  You are a current smoker who has smoked for at least 30 years.  You are a former smoker who has quit within the past 15 years.  Talk to your health care provider about your screening options, when you should start screening, and how often you should be screened. Colorectal Cancer  Routine colorectal cancer screening usually begins at 53 years of age and should be repeated every 5-10 years until you are 53 years old. You may need to be screened more often if early forms of precancerous polyps or small growths are found. Your health care provider may recommend screening at an earlier age if you have risk factors for colon cancer.  Your health care provider may recommend using home test kits to check for hidden blood in the stool.  A small camera at the end of a tube can be used to examine your colon (sigmoidoscopy or colonoscopy). This checks for the earliest forms of colorectal cancer. Prostate and Testicular Cancer  Depending on your age and overall health, your health care provider may do certain tests to screen for prostate and testicular cancer.  Talk to your health care provider about any symptoms or concerns you have about testicular or prostate cancer. Skin Cancer  Check your skin from head   to toe regularly.  Tell your health care provider about any new moles or changes in moles, especially if:  There is a change in a mole's size, shape, or color.  You have a mole that is larger than a pencil eraser.  Always use sunscreen. Apply sunscreen liberally and repeat throughout the day.  Protect yourself by wearing long sleeves, pants, a wide-brimmed hat, and sunglasses when outside. What should I know about heart disease, diabetes, and high blood  pressure?  If you are 18-39 years of age, have your blood pressure checked every 3-5 years. If you are 40 years of age or older, have your blood pressure checked every year. You should have your blood pressure measured twice-once when you are at a hospital or clinic, and once when you are not at a hospital or clinic. Record the average of the two measurements. To check your blood pressure when you are not at a hospital or clinic, you can use:  An automated blood pressure machine at a pharmacy.  A home blood pressure monitor.  Talk to your health care provider about your target blood pressure.  If you are between 45-79 years old, ask your health care provider if you should take aspirin to prevent heart disease.  Have regular diabetes screenings by checking your fasting blood sugar level.  If you are at a normal weight and have a low risk for diabetes, have this test once every three years after the age of 45.  If you are overweight and have a high risk for diabetes, consider being tested at a younger age or more often.  A one-time screening for abdominal aortic aneurysm (AAA) by ultrasound is recommended for men aged 65-75 years who are current or former smokers. What should I know about preventing infection? Hepatitis B  If you have a higher risk for hepatitis B, you should be screened for this virus. Talk with your health care provider to find out if you are at risk for hepatitis B infection. Hepatitis C  Blood testing is recommended for:  Everyone born from 1945 through 1965.  Anyone with known risk factors for hepatitis C. Sexually Transmitted Diseases (STDs)  You should be screened each year for STDs including gonorrhea and chlamydia if:  You are sexually active and are younger than 53 years of age.  You are older than 53 years of age and your health care provider tells you that you are at risk for this type of infection.  Your sexual activity has changed since you were last  screened and you are at an increased risk for chlamydia or gonorrhea. Ask your health care provider if you are at risk.  Talk with your health care provider about whether you are at high risk of being infected with HIV. Your health care provider may recommend a prescription medicine to help prevent HIV infection. What else can I do?  Schedule regular health, dental, and eye exams.  Stay current with your vaccines (immunizations).  Do not use any tobacco products, such as cigarettes, chewing tobacco, and e-cigarettes. If you need help quitting, ask your health care provider.  Limit alcohol intake to no more than 2 drinks per day. One drink equals 12 ounces of beer, 5 ounces of wine, or 1 ounces of hard liquor.  Do not use street drugs.  Do not share needles.  Ask your health care provider for help if you need support or information about quitting drugs.  Tell your health care provider if you   often feel depressed.  Tell your health care provider if you have ever been abused or do not feel safe at home. This information is not intended to replace advice given to you by your health care provider. Make sure you discuss any questions you have with your health care provider. Document Released: 01/27/2008 Document Revised: 03/29/2016 Document Reviewed: 05/04/2015 Elsevier Interactive Patient Education  2017 Elsevier Inc.  American Heart Association (AHA) Exercise Recommendation  Being physically active is important to prevent heart disease and stroke, the nation's No. 1and No. 5killers. To improve overall cardiovascular health, we suggest at least 150 minutes per week of moderate exercise or 75 minutes per week of vigorous exercise (or a combination of moderate and vigorous activity). Thirty minutes a day, five times a week is an easy goal to remember. You will also experience benefits even if you divide your time into two or three segments of 10 to 15 minutes per day.  For people who would  benefit from lowering their blood pressure or cholesterol, we recommend 40 minutes of aerobic exercise of moderate to vigorous intensity three to four times a week to lower the risk for heart attack and stroke.  Physical activity is anything that makes you move your body and burn calories.  This includes things like climbing stairs or playing sports. Aerobic exercises benefit your heart, and include walking, jogging, swimming or biking. Strength and stretching exercises are best for overall stamina and flexibility.  The simplest, positive change you can make to effectively improve your heart health is to start walking. It's enjoyable, free, easy, social and great exercise. A walking program is flexible and boasts high success rates because people can stick with it. It's easy for walking to become a regular and satisfying part of life.   For Overall Cardiovascular Health:  At least 30 minutes of moderate-intensity aerobic activity at least 5 days per week for a total of 150  OR   At least 25 minutes of vigorous aerobic activity at least 3 days per week for a total of 75 minutes; or a combination of moderate- and vigorous-intensity aerobic activity  AND   Moderate- to high-intensity muscle-strengthening activity at least 2 days per week for additional health benefits.  For Lowering Blood Pressure and Cholesterol  An average 40 minutes of moderate- to vigorous-intensity aerobic activity 3 or 4 times per week  What if I can't make it to the time goal? Something is always better than nothing! And everyone has to start somewhere. Even if you've been sedentary for years, today is the day you can begin to make healthy changes in your life. If you don't think you'll make it for 30 or 40 minutes, set a reachable goal for today. You can work up toward your overall goal by increasing your time as you get stronger. Don't let all-or-nothing thinking rob you of doing what you can every day.   Source:http://www.heart.org    Hypertension Hypertension is another name for high blood pressure. High blood pressure forces your heart to work harder to pump blood. This can cause problems over time. There are two numbers in a blood pressure reading. There is a top number (systolic) over a bottom number (diastolic). It is best to have a blood pressure below 120/80. Healthy choices can help lower your blood pressure. You may need medicine to help lower your blood pressure if:  Your blood pressure cannot be lowered with healthy choices.  Your blood pressure is higher than 130/80. Follow these   instructions at home: Eating and drinking   If directed, follow the DASH eating plan. This diet includes:  Filling half of your plate at each meal with fruits and vegetables.  Filling one quarter of your plate at each meal with whole grains. Whole grains include whole wheat pasta, brown rice, and whole grain bread.  Eating or drinking low-fat dairy products, such as skim milk or low-fat yogurt.  Filling one quarter of your plate at each meal with low-fat (lean) proteins. Low-fat proteins include fish, skinless chicken, eggs, beans, and tofu.  Avoiding fatty meat, cured and processed meat, or chicken with skin.  Avoiding premade or processed food.  Eat less than 1,500 mg of salt (sodium) a day.  Limit alcohol use to no more than 1 drink a day for nonpregnant women and 2 drinks a day for men. One drink equals 12 oz of beer, 5 oz of wine, or 1 oz of hard liquor. Lifestyle   Work with your doctor to stay at a healthy weight or to lose weight. Ask your doctor what the best weight is for you.  Get at least 30 minutes of exercise that causes your heart to beat faster (aerobic exercise) most days of the week. This may include walking, swimming, or biking.  Get at least 30 minutes of exercise that strengthens your muscles (resistance exercise) at least 3 days a week. This may include lifting  weights or pilates.  Do not use any products that contain nicotine or tobacco. This includes cigarettes and e-cigarettes. If you need help quitting, ask your doctor.  Check your blood pressure at home as told by your doctor.  Keep all follow-up visits as told by your doctor. This is important. Medicines   Take over-the-counter and prescription medicines only as told by your doctor. Follow directions carefully.  Do not skip doses of blood pressure medicine. The medicine does not work as well if you skip doses. Skipping doses also puts you at risk for problems.  Ask your doctor about side effects or reactions to medicines that you should watch for. Contact a doctor if:  You think you are having a reaction to the medicine you are taking.  You have headaches that keep coming back (recurring).  You feel dizzy.  You have swelling in your ankles.  You have trouble with your vision. Get help right away if:  You get a very bad headache.  You start to feel confused.  You feel weak or numb.  You feel faint.  You get very bad pain in your:  Chest.  Belly (abdomen).  You throw up (vomit) more than once.  You have trouble breathing. Summary  Hypertension is another name for high blood pressure.  Making healthy choices can help lower blood pressure. If your blood pressure cannot be controlled with healthy choices, you may need to take medicine. This information is not intended to replace advice given to you by your health care provider. Make sure you discuss any questions you have with your health care provider. Document Released: 01/17/2008 Document Revised: 06/28/2016 Document Reviewed: 06/28/2016 Elsevier Interactive Patient Education  2017 Elsevier Inc.  

## 2016-12-07 LAB — COMPREHENSIVE METABOLIC PANEL
ALT: 18 IU/L (ref 0–44)
AST: 16 IU/L (ref 0–40)
Albumin/Globulin Ratio: 2.1 (ref 1.2–2.2)
Albumin: 4.5 g/dL (ref 3.5–5.5)
Alkaline Phosphatase: 64 IU/L (ref 39–117)
BUN/Creatinine Ratio: 6 — ABNORMAL LOW (ref 9–20)
BUN: 7 mg/dL (ref 6–24)
Bilirubin Total: 0.7 mg/dL (ref 0.0–1.2)
CO2: 25 mmol/L (ref 18–29)
Calcium: 9.5 mg/dL (ref 8.7–10.2)
Chloride: 99 mmol/L (ref 96–106)
Creatinine, Ser: 1.2 mg/dL (ref 0.76–1.27)
GFR calc Af Amer: 80 mL/min/{1.73_m2} (ref >59)
GFR calc non Af Amer: 69 mL/min/{1.73_m2} (ref >59)
Globulin, Total: 2.1 g/dL (ref 1.5–4.5)
Glucose: 91 mg/dL (ref 65–99)
Potassium: 4.2 mmol/L (ref 3.5–5.2)
Sodium: 140 mmol/L (ref 134–144)
Total Protein: 6.6 g/dL (ref 6.0–8.5)

## 2016-12-07 LAB — CBC WITH DIFFERENTIAL/PLATELET
Basophils Absolute: 0 10*3/uL (ref 0.0–0.2)
Basos: 1 %
EOS (ABSOLUTE): 0.1 10*3/uL (ref 0.0–0.4)
Eos: 2 %
Hematocrit: 43.6 % (ref 37.5–51.0)
Hemoglobin: 13.9 g/dL (ref 13.0–17.7)
Immature Grans (Abs): 0 10*3/uL (ref 0.0–0.1)
Immature Granulocytes: 0 %
Lymphocytes Absolute: 1.8 10*3/uL (ref 0.7–3.1)
Lymphs: 34 %
MCH: 25.5 pg — ABNORMAL LOW (ref 26.6–33.0)
MCHC: 31.9 g/dL (ref 31.5–35.7)
MCV: 80 fL (ref 79–97)
Monocytes Absolute: 0.4 10*3/uL (ref 0.1–0.9)
Monocytes: 7 %
Neutrophils Absolute: 2.9 10*3/uL (ref 1.4–7.0)
Neutrophils: 56 %
Platelets: 782 10*3/uL — ABNORMAL HIGH (ref 150–379)
RBC: 5.45 x10E6/uL (ref 4.14–5.80)
RDW: 15.9 % — ABNORMAL HIGH (ref 12.3–15.4)
WBC: 5.2 10*3/uL (ref 3.4–10.8)

## 2016-12-07 LAB — LIPID PANEL
Chol/HDL Ratio: 3.4 ratio (ref 0.0–5.0)
Cholesterol, Total: 172 mg/dL (ref 100–199)
HDL: 51 mg/dL (ref >39)
LDL Calculated: 103 mg/dL — ABNORMAL HIGH (ref 0–99)
Triglycerides: 90 mg/dL (ref 0–149)
VLDL Cholesterol Cal: 18 mg/dL (ref 5–40)

## 2016-12-07 LAB — TSH: TSH: 1.23 u[IU]/mL (ref 0.450–4.500)

## 2016-12-07 LAB — HEPATITIS C ANTIBODY: Hep C Virus Ab: <0.1 s/co ratio (ref 0.0–0.9)

## 2016-12-07 LAB — PSA: Prostate Specific Ag, Serum: 0.4 ng/mL (ref 0.0–4.0)

## 2016-12-07 LAB — HIV ANTIBODY (ROUTINE TESTING W REFLEX): HIV Screen 4th Generation wRfx: NONREACTIVE

## 2016-12-07 LAB — HEMOGLOBIN A1C
Est. average glucose Bld gHb Est-mCnc: 120 mg/dL
Hgb A1c MFr Bld: 5.8 % — ABNORMAL HIGH (ref 4.8–5.6)

## 2017-03-08 ENCOUNTER — Ambulatory Visit (INDEPENDENT_AMBULATORY_CARE_PROVIDER_SITE_OTHER): Payer: 59 | Admitting: Urgent Care

## 2017-03-08 ENCOUNTER — Encounter: Payer: Self-pay | Admitting: Urgent Care

## 2017-03-08 VITALS — BP 136/70 | HR 93 | Temp 97.7°F | Resp 16 | Ht 73.0 in | Wt 237.2 lb

## 2017-03-08 DIAGNOSIS — Z024 Encounter for examination for driving license: Secondary | ICD-10-CM

## 2017-03-08 DIAGNOSIS — I10 Essential (primary) hypertension: Secondary | ICD-10-CM

## 2017-03-08 NOTE — Patient Instructions (Addendum)
Hypertension Hypertension, commonly called high blood pressure, is when the force of blood pumping through the arteries is too strong. The arteries are the blood vessels that carry blood from the heart throughout the body. Hypertension forces the heart to work harder to pump blood and may cause arteries to become narrow or stiff. Having untreated or uncontrolled hypertension can cause heart attacks, strokes, kidney disease, and other problems. A blood pressure reading consists of a higher number over a lower number. Ideally, your blood pressure should be below 120/80. The first ("top") number is called the systolic pressure. It is a measure of the pressure in your arteries as your heart beats. The second ("bottom") number is called the diastolic pressure. It is a measure of the pressure in your arteries as the heart relaxes. What are the causes? The cause of this condition is not known. What increases the risk? Some risk factors for high blood pressure are under your control. Others are not. Factors you can change  Smoking.  Having type 2 diabetes mellitus, high cholesterol, or both.  Not getting enough exercise or physical activity.  Being overweight.  Having too much fat, sugar, calories, or salt (sodium) in your diet.  Drinking too much alcohol. Factors that are difficult or impossible to change  Having chronic kidney disease.  Having a family history of high blood pressure.  Age. Risk increases with age.  Race. You may be at higher risk if you are African-American.  Gender. Men are at higher risk than women before age 45. After age 65, women are at higher risk than men.  Having obstructive sleep apnea.  Stress. What are the signs or symptoms? Extremely high blood pressure (hypertensive crisis) may cause:  Headache.  Anxiety.  Shortness of breath.  Nosebleed.  Nausea and vomiting.  Severe chest pain.  Jerky movements you cannot control (seizures).  How is this  diagnosed? This condition is diagnosed by measuring your blood pressure while you are seated, with your arm resting on a surface. The cuff of the blood pressure monitor will be placed directly against the skin of your upper arm at the level of your heart. It should be measured at least twice using the same arm. Certain conditions can cause a difference in blood pressure between your right and left arms. Certain factors can cause blood pressure readings to be lower or higher than normal (elevated) for a short period of time:  When your blood pressure is higher when you are in a health care provider's office than when you are at home, this is called white coat hypertension. Most people with this condition do not need medicines.  When your blood pressure is higher at home than when you are in a health care provider's office, this is called masked hypertension. Most people with this condition may need medicines to control blood pressure.  If you have a high blood pressure reading during one visit or you have normal blood pressure with other risk factors:  You may be asked to return on a different day to have your blood pressure checked again.  You may be asked to monitor your blood pressure at home for 1 week or longer.  If you are diagnosed with hypertension, you may have other blood or imaging tests to help your health care provider understand your overall risk for other conditions. How is this treated? This condition is treated by making healthy lifestyle changes, such as eating healthy foods, exercising more, and reducing your alcohol intake. Your   health care provider may prescribe medicine if lifestyle changes are not enough to get your blood pressure under control, and if:  Your systolic blood pressure is above 130.  Your diastolic blood pressure is above 80.  Your personal target blood pressure may vary depending on your medical conditions, your age, and other factors. Follow these  instructions at home: Eating and drinking  Eat a diet that is high in fiber and potassium, and low in sodium, added sugar, and fat. An example eating plan is called the DASH (Dietary Approaches to Stop Hypertension) diet. To eat this way: ? Eat plenty of fresh fruits and vegetables. Try to fill half of your plate at each meal with fruits and vegetables. ? Eat whole grains, such as whole wheat pasta, brown rice, or whole grain bread. Fill about one quarter of your plate with whole grains. ? Eat or drink low-fat dairy products, such as skim milk or low-fat yogurt. ? Avoid fatty cuts of meat, processed or cured meats, and poultry with skin. Fill about one quarter of your plate with lean proteins, such as fish, chicken without skin, beans, eggs, and tofu. ? Avoid premade and processed foods. These tend to be higher in sodium, added sugar, and fat.  Reduce your daily sodium intake. Most people with hypertension should eat less than 1,500 mg of sodium a day.  Limit alcohol intake to no more than 1 drink a day for nonpregnant women and 2 drinks a day for men. One drink equals 12 oz of beer, 5 oz of wine, or 1 oz of hard liquor. Lifestyle  Work with your health care provider to maintain a healthy body weight or to lose weight. Ask what an ideal weight is for you.  Get at least 30 minutes of exercise that causes your heart to beat faster (aerobic exercise) most days of the week. Activities may include walking, swimming, or biking.  Include exercise to strengthen your muscles (resistance exercise), such as pilates or lifting weights, as part of your weekly exercise routine. Try to do these types of exercises for 30 minutes at least 3 days a week.  Do not use any products that contain nicotine or tobacco, such as cigarettes and e-cigarettes. If you need help quitting, ask your health care provider.  Monitor your blood pressure at home as told by your health care provider.  Keep all follow-up visits as  told by your health care provider. This is important. Medicines  Take over-the-counter and prescription medicines only as told by your health care provider. Follow directions carefully. Blood pressure medicines must be taken as prescribed.  Do not skip doses of blood pressure medicine. Doing this puts you at risk for problems and can make the medicine less effective.  Ask your health care provider about side effects or reactions to medicines that you should watch for. Contact a health care provider if:  You think you are having a reaction to a medicine you are taking.  You have headaches that keep coming back (recurring).  You feel dizzy.  You have swelling in your ankles.  You have trouble with your vision. Get help right away if:  You develop a severe headache or confusion.  You have unusual weakness or numbness.  You feel faint.  You have severe pain in your chest or abdomen.  You vomit repeatedly.  You have trouble breathing. Summary  Hypertension is when the force of blood pumping through your arteries is too strong. If this condition is not   controlled, it may put you at risk for serious complications.  Your personal target blood pressure may vary depending on your medical conditions, your age, and other factors. For most people, a normal blood pressure is less than 120/80.  Hypertension is treated with lifestyle changes, medicines, or a combination of both. Lifestyle changes include weight loss, eating a healthy, low-sodium diet, exercising more, and limiting alcohol. This information is not intended to replace advice given to you by your health care provider. Make sure you discuss any questions you have with your health care provider. Document Released: 07/31/2005 Document Revised: 06/28/2016 Document Reviewed: 06/28/2016 Elsevier Interactive Patient Education  2018 Elsevier Inc.     IF you received an x-ray today, you will receive an invoice from [facility]. Please contact [facility] at [phone] with questions or concerns regarding your invoice.   IF you received labwork today, you will receive an invoice from [facility]. Please contact [facility] at [phone] with questions or concerns regarding your invoice.   Our billing staff will not be able to assist you with questions regarding bills from these companies.  You will be contacted with the lab results as soon as they are available. The fastest way to get your results is to activate your My Chart account. Instructions are located on the last page of this paperwork. If you have not heard from us regarding the results in 2 weeks, please contact this office.     

## 2017-03-08 NOTE — Progress Notes (Signed)
    MRN: [id number] DOB: 04-15-1966  Subjective:   Stephen Patrick is a 51 y.o. 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 Denies dizziness, chronic headache, blurred vision, chest pain, shortness of breath, heart racing, palpitations, nausea, vomiting, abdominal pain, hematuria, lower leg swelling. Denies smoking cigarettes.  Stephen Patrick has a current medication list which includes the following prescription(s): lisinopril-hydrochlorothiazide. Also has No Known Allergies.  Stephen Patrick  has a past medical history of Hypertension and Thrombocytosis ([place]). Also  has no past surgical history on file.  Objective:   Vitals: BP 136/70 (BP Location: Right Arm, Patient Position: Sitting, Cuff Size: Large)   Pulse 93   Temp 97.7 F (36.5 C) (Oral)   Resp 16   Ht 6\' 1"  (1.854 m)   Wt 237 lb 3.2 oz (107.6 kg)   SpO2 98%   BMI 31.29 kg/m   Physical Exam  Constitutional: He is oriented to person, place, and time. He appears well-developed and well-nourished.  Cardiovascular: Normal rate.   Pulmonary/Chest: Effort normal.  Neurological: He is alert and oriented to person, place, and time.   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 New card will read expiration date of 11/24/2017.  Jaynee Eagles, PA-C [facility] [phone] 03/08/2017 1:58 PM

## 2017-03-08 NOTE — Progress Notes (Deleted)
    MRN: [id number] DOB: 1966/04/25  Subjective:   Stephen Patrick is a 51 y.o. male presenting for follow up on Hypertension.   Currently managed with ***. Patient {is/are not:32546} checking blood pressure at home, generally *** systolic. Avoids salt in diet, {is/are not:32546} exercising. Reports ***. Denies lightheadedness, dizziness, chronic headache, blurred vision, chest pain, shortness of breath, heart racing, palpitations, nausea, vomiting, abdominal pain, hematuria, lower leg swelling. Denies smoking cigarettes or drinking alcohol.   Jordyn has a current medication list which includes the following prescription(s): lisinopril-hydrochlorothiazide. Also has No Known Allergies.  Rawad  has a past medical history of Hypertension and Thrombocytosis ([place]). Also  has no past surgical history on file.  Objective:   Vitals: BP 136/70 (BP Location: Right Arm, Patient Position: Sitting, Cuff Size: Large)   Pulse 93   Temp 97.7 F (36.5 C) (Oral)   Resp 16   Ht 6\' 1"  (1.854 m)   Wt 237 lb 3.2 oz (107.6 kg)   SpO2 98%   BMI 31.29 kg/m   Physical Exam  No results found for this or any previous visit (from the past 24 hour(s)).  Assessment and Plan :     Jaynee Eagles, PA-C [facility] [phone] 03/08/2017  1:33 PM
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
## 2017-12-10 ENCOUNTER — Other Ambulatory Visit: Payer: Self-pay | Admitting: Emergency Medicine

## 2017-12-10 ENCOUNTER — Telehealth: Payer: Self-pay | Admitting: *Deleted

## 2017-12-10 NOTE — Telephone Encounter (Signed)
Called in prescription  Advised office visit will be needed for 90 day supply

## 2018-01-25 ENCOUNTER — Encounter: Payer: Self-pay | Admitting: Emergency Medicine

## 2018-01-25 ENCOUNTER — Ambulatory Visit (INDEPENDENT_AMBULATORY_CARE_PROVIDER_SITE_OTHER): Payer: BLUE CROSS/BLUE SHIELD | Admitting: Emergency Medicine

## 2018-01-25 ENCOUNTER — Encounter: Payer: 59 | Admitting: Emergency Medicine

## 2018-01-25 ENCOUNTER — Other Ambulatory Visit: Payer: Self-pay

## 2018-01-25 VITALS — BP 136/73 | HR 60 | Temp 97.7°F | Ht 74.0 in | Wt 233.5 lb

## 2018-01-25 DIAGNOSIS — Z1211 Encounter for screening for malignant neoplasm of colon: Secondary | ICD-10-CM

## 2018-01-25 DIAGNOSIS — I10 Essential (primary) hypertension: Secondary | ICD-10-CM | POA: Diagnosis not present

## 2018-01-25 DIAGNOSIS — Z0000 Encounter for general adult medical examination without abnormal findings: Secondary | ICD-10-CM

## 2018-01-25 MED ORDER — LISINOPRIL-HYDROCHLOROTHIAZIDE 10-12.5 MG PO TABS: ORAL_TABLET | ORAL | 3 refills | Status: DC

## 2018-01-25 NOTE — Progress Notes (Signed)
Stephen Patrick 52 y.o.   Chief Complaint  Patient presents with  . Annual Exam    CPE: med refill and  wants to make Dr. his PCP    HISTORY OF PRESENT ILLNESS: This is a 52 y.o. male Here for annual exam; no complaints and no medical concerns.   HPI   Prior to Admission medications   Medication Sig Start Date End Date Taking? Authorizing Provider  lisinopril-hydrochlorothiazide (PRINZIDE,ZESTORETIC) 10-12.5 MG tablet TAKE 1 TABLET BY MOUTH EVERY DAY Patient needs appointment 12/10/17  Yes Horald Pollen, MD    No Known Allergies  Patient Active Problem List   Diagnosis Date Noted  . Essential hypertension 12/06/2016  . Thrombocytosis ([place]) 03/30/2016    Past Medical History:  Diagnosis Date  . Hypertension   . Thrombocytosis ([place])     No past surgical history on file.  Social History   Socioeconomic History  . Marital status: Married    Spouse name: Not on file  . Number of children: Not on file  . Years of education: Not on file  . Highest education level: Not on file  Occupational History  . Not on file  Social Needs  . Financial resource strain: Not on file  . Food insecurity:    Worry: Not on file    Inability: Not on file  . Transportation needs:    Medical: Not on file    Non-medical: Not on file  Tobacco Use  . Smoking status: Never Smoker  . Smokeless tobacco: Never Used  Substance and Sexual Activity  . Alcohol use: No  . Drug use: No  . Sexual activity: Never  Lifestyle  . Physical activity:    Days per week: Not on file    Minutes per session: Not on file  . Stress: Not on file  Relationships  . Social connections:    Talks on phone: Not on file    Gets together: Not on file    Attends religious service: Not on file    Active member of club or organization: Not on file    Attends meetings of clubs or organizations: Not on file    Relationship status: Not on file  . Intimate partner violence:    Fear of current or ex  partner: Not on file    Emotionally abused: Not on file    Physically abused: Not on file    Forced sexual activity: Not on file  Other Topics Concern  . Not on file  Social History Narrative  . Not on file    Family History  Problem Relation Age of Onset  . Cancer Father      Review of Systems  Constitutional: Negative.  Negative for chills, fever and weight loss.  HENT: Negative.  Negative for congestion, hearing loss, nosebleeds and sore throat.   Eyes: Negative.   Respiratory: Negative.  Negative for cough and shortness of breath.   Cardiovascular: Negative.  Negative for chest pain and palpitations.  Gastrointestinal: Negative.  Negative for abdominal pain, diarrhea, nausea and vomiting.  Genitourinary: Negative.  Negative for dysuria and hematuria.  Musculoskeletal: Negative.   Skin: Negative.  Negative for rash.  Neurological: Negative.  Negative for dizziness and headaches.  Endo/Heme/Allergies: Negative.   All other systems reviewed and are negative.   Vitals:   01/25/18 1720  BP: 136/73  Pulse: 60  Temp: 97.7 F (36.5 C)  SpO2: 92%    Physical Exam  Constitutional: He is oriented to person, place,  and time. He appears well-developed and well-nourished.  HENT:  Head: Normocephalic and atraumatic.  Right Ear: External ear normal.  Left Ear: External ear normal.  Nose: Nose normal.  Mouth/Throat: Oropharynx is clear and moist.  Eyes: Pupils are equal, round, and reactive to light. Conjunctivae and EOM are normal.  Neck: Normal range of motion. Neck supple.  Cardiovascular: Normal rate, regular rhythm, normal heart sounds and intact distal pulses.  Pulmonary/Chest: Effort normal and breath sounds normal.  Abdominal: Soft. Bowel sounds are normal. He exhibits no distension. There is no tenderness.  Musculoskeletal: Normal range of motion. He exhibits no edema.  Neurological: He is alert and oriented to person, place, and time. No sensory deficit. He exhibits  normal muscle tone.  Skin: Skin is warm and dry. Capillary refill takes less than 2 seconds.  Psychiatric: He has a normal mood and affect. His behavior is normal.  Vitals reviewed.    ASSESSMENT & PLAN: Stephen Patrick was seen today for annual exam.  Diagnoses and all orders for this visit:  Routine general medical examination at a health care facility -     CBC with Differential -     Comprehensive metabolic panel -     Hemoglobin A1c -     Lipid panel -     PSA(Must document that pt has been informed of limitations of PSA testing.)  Colon cancer screening -     Ambulatory referral to Gastroenterology  Essential hypertension -     lisinopril-hydrochlorothiazide (PRINZIDE,ZESTORETIC) 10-12.5 MG tablet; TAKE 1 TABLET BY MOUTH EVERY DAY    Patient Instructions       IF you received an x-ray today, you will receive an invoice from [facility]. Please contact [facility] at [phone] with questions or concerns regarding your invoice.   IF you received labwork today, you will receive an invoice from [facility]. Please contact [facility] at [phone] with questions or concerns regarding your invoice.   Our billing staff will not be able to assist you with questions regarding bills from these companies.  You will be contacted with the lab results as soon as they are available. The fastest way to get your results is to activate your My Chart account. Instructions are located on the last page of this paperwork. If you have not heard from [place] regarding the results in 2 weeks, please contact this office.       Health Maintenance, Male A healthy lifestyle and preventive care is important for your health and wellness. Ask your health care provider about what schedule of regular examinations is right for you. What should I know about weight and diet? Eat a Healthy Diet  Eat plenty of vegetables, fruits, whole grains, low-fat dairy products, and lean protein.  Do not  eat a lot of foods high in solid fats, added sugars, or salt.  Maintain a Healthy Weight Regular exercise can help you achieve or maintain a healthy weight. You should:  Do at least 150 minutes of exercise each week. The exercise should increase your heart rate and make you sweat (moderate-intensity exercise).  Do strength-training exercises at least twice a week.  Watch Your Levels of Cholesterol and Blood Lipids  Have your blood tested for lipids and cholesterol every 5 years starting at 54 years of age. If you are at high risk for heart disease, you should start having your blood tested when you are 54 years old. You may need to have your cholesterol levels checked more often if: ? Your  lipid or cholesterol levels are high. ? You are older than 54 years of age. ? You are at high risk for heart disease.  What should I know about cancer screening? Many types of cancers can be detected early and may often be prevented. Lung Cancer  You should be screened every year for lung cancer if: ? You are a current smoker who has smoked for at least 30 years. ? You are a former smoker who has quit within the past 15 years.  Talk to your health care provider about your screening options, when you should start screening, and how often you should be screened.  Colorectal Cancer  Routine colorectal cancer screening usually begins at 54 years of age and should be repeated every 5-10 years until you are 54 years old. You may need to be screened more often if early forms of precancerous polyps or small growths are found. Your health care provider may recommend screening at an earlier age if you have risk factors for colon cancer.  Your health care provider may recommend using home test kits to check for hidden blood in the stool.  A small camera at the end of a tube can be used to examine your colon (sigmoidoscopy or colonoscopy). This checks for the earliest forms of colorectal cancer.  Prostate and  Testicular Cancer  Depending on your age and overall health, your health care provider may do certain tests to screen for prostate and testicular cancer.  Talk to your health care provider about any symptoms or concerns you have about testicular or prostate cancer.  Skin Cancer  Check your skin from head to toe regularly.  Tell your health care provider about any new moles or changes in moles, especially if: ? There is a change in a mole's size, shape, or color. ? You have a mole that is larger than a pencil eraser.  Always use sunscreen. Apply sunscreen liberally and repeat throughout the day.  Protect yourself by wearing long sleeves, pants, a wide-brimmed hat, and sunglasses when outside.  What should I know about heart disease, diabetes, and high blood pressure?  If you are 71-3 years of age, have your blood pressure checked every 3-5 years. If you are 49 years of age or older, have your blood pressure checked every year. You should have your blood pressure measured twice-once when you are at a hospital or clinic, and once when you are not at a hospital or clinic. Record the average of the two measurements. To check your blood pressure when you are not at a hospital or clinic, you can use: ? An automated blood pressure machine at a pharmacy. ? A home blood pressure monitor.  Talk to your health care provider about your target blood pressure.  If you are between 8-80 years old, ask your health care provider if you should take aspirin to prevent heart disease.  Have regular diabetes screenings by checking your fasting blood sugar level. ? If you are at a normal weight and have a low risk for diabetes, have this test once every three years after the age of 12. ? If you are overweight and have a high risk for diabetes, consider being tested at a younger age or more often.  A one-time screening for abdominal aortic aneurysm (AAA) by ultrasound is recommended for men aged 52-75 years  who are current or former smokers. What should I know about preventing infection? Hepatitis B If you have a higher risk for hepatitis B, you  should be screened for this virus. Talk with your health care provider to find out if you are at risk for hepatitis B infection. Hepatitis C Blood testing is recommended for:  Everyone born from [birth year] through 1965.  Anyone with known risk factors for hepatitis C.  Sexually Transmitted Diseases (STDs)  You should be screened each year for STDs including gonorrhea and chlamydia if: ? You are sexually active and are younger than 54 years of age. ? You are older than 54 years of age and your health care provider tells you that you are at risk for this type of infection. ? Your sexual activity has changed since you were last screened and you are at an increased risk for chlamydia or gonorrhea. Ask your health care provider if you are at risk.  Talk with your health care provider about whether you are at high risk of being infected with HIV. Your health care provider may recommend a prescription medicine to help prevent HIV infection.  What else can I do?  Schedule regular health, dental, and eye exams.  Stay current with your vaccines (immunizations).  Do not use any tobacco products, such as cigarettes, chewing tobacco, and e-cigarettes. If you need help quitting, ask your health care provider.  Limit alcohol intake to no more than 2 drinks per day. One drink equals 12 ounces of beer, 5 ounces of wine, or 1 ounces of hard liquor.  Do not use street drugs.  Do not share needles.  Ask your health care provider for help if you need support or information about quitting drugs.  Tell your health care provider if you often feel depressed.  Tell your health care provider if you have ever been abused or do not feel safe at home. This information is not intended to replace advice given to you by your health care provider. Make sure you discuss any  questions you have with your health care provider. Document Released: 01/27/2008 Document Revised: 03/29/2016 Document Reviewed: 05/04/2015 Elsevier Interactive Patient Education  2018 Elsevier Inc.      Agustina Caroli, MD [facility]

## 2018-01-25 NOTE — Patient Instructions (Addendum)
   IF you received an x-ray today, you will receive an invoice from [facility]. Please contact [facility] at [phone] with questions or concerns regarding your invoice.   IF you received labwork today, you will receive an invoice from [facility]. Please contact [facility] at [phone] with questions or concerns regarding your invoice.   Our billing staff will not be able to assist you with questions regarding bills from these companies.  You will be contacted with the lab results as soon as they are available. The fastest way to get your results is to activate your My Chart account. Instructions are located on the last page of this paperwork. If you have not heard from us regarding the results in 2 weeks, please contact this office.      Health Maintenance, Male A healthy lifestyle and preventive care is important for your health and wellness. Ask your health care provider about what schedule of regular examinations is right for you. What should I know about weight and diet? Eat a Healthy Diet  Eat plenty of vegetables, fruits, whole grains, low-fat dairy products, and lean protein.  Do not eat a lot of foods high in solid fats, added sugars, or salt.  Maintain a Healthy Weight Regular exercise can help you achieve or maintain a healthy weight. You should:  Do at least 150 minutes of exercise each week. The exercise should increase your heart rate and make you sweat (moderate-intensity exercise).  Do strength-training exercises at least twice a week.  Watch Your Levels of Cholesterol and Blood Lipids  Have your blood tested for lipids and cholesterol every 5 years starting at 54 years of age. If you are at high risk for heart disease, you should start having your blood tested when you are 54 years old. You may need to have your cholesterol levels checked more often if: ? Your lipid or cholesterol levels are high. ? You are older than 54 years of age. ? You  are at high risk for heart disease.  What should I know about cancer screening? Many types of cancers can be detected early and may often be prevented. Lung Cancer  You should be screened every year for lung cancer if: ? You are a current smoker who has smoked for at least 30 years. ? You are a former smoker who has quit within the past 15 years.  Talk to your health care provider about your screening options, when you should start screening, and how often you should be screened.  Colorectal Cancer  Routine colorectal cancer screening usually begins at 54 years of age and should be repeated every 5-10 years until you are 54 years old. You may need to be screened more often if early forms of precancerous polyps or small growths are found. Your health care provider may recommend screening at an earlier age if you have risk factors for colon cancer.  Your health care provider may recommend using home test kits to check for hidden blood in the stool.  A small camera at the end of a tube can be used to examine your colon (sigmoidoscopy or colonoscopy). This checks for the earliest forms of colorectal cancer.  Prostate and Testicular Cancer  Depending on your age and overall health, your health care provider may do certain tests to screen for prostate and testicular cancer.  Talk to your health care provider about any symptoms or concerns you have about testicular or prostate cancer.  Skin Cancer  Check your skin   from head to toe regularly.  Tell your health care provider about any new moles or changes in moles, especially if: ? There is a change in a mole's size, shape, or color. ? You have a mole that is larger than a pencil eraser.  Always use sunscreen. Apply sunscreen liberally and repeat throughout the day.  Protect yourself by wearing long sleeves, pants, a wide-brimmed hat, and sunglasses when outside.  What should I know about heart disease, diabetes, and high blood  pressure?  If you are 18-39 years of age, have your blood pressure checked every 3-5 years. If you are 40 years of age or older, have your blood pressure checked every year. You should have your blood pressure measured twice-once when you are at a hospital or clinic, and once when you are not at a hospital or clinic. Record the average of the two measurements. To check your blood pressure when you are not at a hospital or clinic, you can use: ? An automated blood pressure machine at a pharmacy. ? A home blood pressure monitor.  Talk to your health care provider about your target blood pressure.  If you are between 45-79 years old, ask your health care provider if you should take aspirin to prevent heart disease.  Have regular diabetes screenings by checking your fasting blood sugar level. ? If you are at a normal weight and have a low risk for diabetes, have this test once every three years after the age of 45. ? If you are overweight and have a high risk for diabetes, consider being tested at a younger age or more often.  A one-time screening for abdominal aortic aneurysm (AAA) by ultrasound is recommended for men aged 65-75 years who are current or former smokers. What should I know about preventing infection? Hepatitis B If you have a higher risk for hepatitis B, you should be screened for this virus. Talk with your health care provider to find out if you are at risk for hepatitis B infection. Hepatitis C Blood testing is recommended for:  Everyone born from 1945 through 1965.  Anyone with known risk factors for hepatitis C.  Sexually Transmitted Diseases (STDs)  You should be screened each year for STDs including gonorrhea and chlamydia if: ? You are sexually active and are younger than 54 years of age. ? You are older than 54 years of age and your health care provider tells you that you are at risk for this type of infection. ? Your sexual activity has changed since you were last  screened and you are at an increased risk for chlamydia or gonorrhea. Ask your health care provider if you are at risk.  Talk with your health care provider about whether you are at high risk of being infected with HIV. Your health care provider may recommend a prescription medicine to help prevent HIV infection.  What else can I do?  Schedule regular health, dental, and eye exams.  Stay current with your vaccines (immunizations).  Do not use any tobacco products, such as cigarettes, chewing tobacco, and e-cigarettes. If you need help quitting, ask your health care provider.  Limit alcohol intake to no more than 2 drinks per day. One drink equals 12 ounces of beer, 5 ounces of wine, or 1 ounces of hard liquor.  Do not use street drugs.  Do not share needles.  Ask your health care provider for help if you need support or information about quitting drugs.  Tell your health care   provider if you often feel depressed.  Tell your health care provider if you have ever been abused or do not feel safe at home. This information is not intended to replace advice given to you by your health care provider. Make sure you discuss any questions you have with your health care provider. Document Released: 01/27/2008 Document Revised: 03/29/2016 Document Reviewed: 05/04/2015 Elsevier Interactive Patient Education  2018 [employer]vier Inc.  

## 2018-01-26 LAB — HEMOGLOBIN A1C
Est. average glucose Bld gHb Est-mCnc: 123 mg/dL
Hgb A1c MFr Bld: 5.9 % — ABNORMAL HIGH (ref 4.8–5.6)

## 2018-01-26 LAB — COMPREHENSIVE METABOLIC PANEL
ALT: 22 IU/L (ref 0–44)
AST: 28 IU/L (ref 0–40)
Albumin/Globulin Ratio: 2.1 (ref 1.2–2.2)
Albumin: 4.6 g/dL (ref 3.5–5.5)
Alkaline Phosphatase: 67 IU/L (ref 39–117)
BUN/Creatinine Ratio: 16 (ref 9–20)
BUN: 20 mg/dL (ref 6–24)
Bilirubin Total: 0.4 mg/dL (ref 0.0–1.2)
CO2: 23 mmol/L (ref 20–29)
Calcium: 9.5 mg/dL (ref 8.7–10.2)
Chloride: 104 mmol/L (ref 96–106)
Creatinine, Ser: 1.29 mg/dL — ABNORMAL HIGH (ref 0.76–1.27)
GFR calc Af Amer: 73 mL/min/{1.73_m2} (ref >59)
GFR calc non Af Amer: 63 mL/min/{1.73_m2} (ref >59)
Globulin, Total: 2.2 g/dL (ref 1.5–4.5)
Glucose: 88 mg/dL (ref 65–99)
Potassium: 4.7 mmol/L (ref 3.5–5.2)
Sodium: 143 mmol/L (ref 134–144)
Total Protein: 6.8 g/dL (ref 6.0–8.5)

## 2018-01-26 LAB — LIPID PANEL
Chol/HDL Ratio: 3.5 ratio (ref 0.0–5.0)
Cholesterol, Total: 178 mg/dL (ref 100–199)
HDL: 51 mg/dL (ref >39)
LDL Calculated: 106 mg/dL — ABNORMAL HIGH (ref 0–99)
Triglycerides: 107 mg/dL (ref 0–149)
VLDL Cholesterol Cal: 21 mg/dL (ref 5–40)

## 2018-01-26 LAB — CBC WITH DIFFERENTIAL/PLATELET
Basophils Absolute: 0 10*3/uL (ref 0.0–0.2)
Basos: 1 %
EOS (ABSOLUTE): 0.1 10*3/uL (ref 0.0–0.4)
Eos: 2 %
Hematocrit: 44.7 % (ref 37.5–51.0)
Hemoglobin: 14.4 g/dL (ref 13.0–17.7)
Immature Grans (Abs): 0 10*3/uL (ref 0.0–0.1)
Immature Granulocytes: 0 %
Lymphocytes Absolute: 2.2 10*3/uL (ref 0.7–3.1)
Lymphs: 35 %
MCH: 25.1 pg — ABNORMAL LOW (ref 26.6–33.0)
MCHC: 32.2 g/dL (ref 31.5–35.7)
MCV: 78 fL — ABNORMAL LOW (ref 79–97)
Monocytes Absolute: 0.5 10*3/uL (ref 0.1–0.9)
Monocytes: 8 %
Neutrophils Absolute: 3.4 10*3/uL (ref 1.4–7.0)
Neutrophils: 54 %
Platelets: 746 10*3/uL — ABNORMAL HIGH (ref 150–450)
RBC: 5.73 x10E6/uL (ref 4.14–5.80)
RDW: 14.4 % (ref 12.3–15.4)
WBC: 6.2 10*3/uL (ref 3.4–10.8)

## 2018-01-26 LAB — PSA: Prostate Specific Ag, Serum: 0.4 ng/mL (ref 0.0–4.0)

## 2018-01-29 ENCOUNTER — Encounter: Payer: Self-pay | Admitting: *Deleted

## 2018-03-01 ENCOUNTER — Encounter: Payer: Self-pay | Admitting: Emergency Medicine

## 2018-04-02 ENCOUNTER — Encounter: Payer: Self-pay | Admitting: Physician Assistant

## 2018-04-02 ENCOUNTER — Ambulatory Visit (INDEPENDENT_AMBULATORY_CARE_PROVIDER_SITE_OTHER): Payer: 59 | Admitting: Physician Assistant

## 2018-04-02 ENCOUNTER — Other Ambulatory Visit: Payer: Self-pay

## 2018-04-02 VITALS — BP 144/94 | HR 62 | Temp 98.3°F | Resp 16 | Ht 74.0 in | Wt 227.0 lb

## 2018-04-02 DIAGNOSIS — R8290 Unspecified abnormal findings in urine: Secondary | ICD-10-CM | POA: Diagnosis not present

## 2018-04-02 DIAGNOSIS — R1032 Left lower quadrant pain: Secondary | ICD-10-CM

## 2018-04-02 DIAGNOSIS — R369 Urethral discharge, unspecified: Secondary | ICD-10-CM

## 2018-04-02 LAB — POCT URINALYSIS DIP (MANUAL ENTRY)
Bilirubin, UA: NEGATIVE
Glucose, UA: NEGATIVE mg/dL
Ketones, POC UA: NEGATIVE mg/dL
Nitrite, UA: NEGATIVE
Protein Ur, POC: NEGATIVE mg/dL
Spec Grav, UA: 1.02 (ref 1.010–1.025)
Urobilinogen, UA: 0.2 E.U./dL
pH, UA: 6 (ref 5.0–8.0)

## 2018-04-02 LAB — POC MICROSCOPIC URINALYSIS (UMFC): Mucus: ABSENT

## 2018-04-02 MED ORDER — AZITHROMYCIN 250 MG PO TABS: ORAL_TABLET | ORAL | 0 refills | Status: DC

## 2018-04-02 MED ORDER — CEFTRIAXONE SODIUM 250 MG IJ SOLR
250.0000 mg | Freq: Once | INTRAMUSCULAR | Status: AC
  Administered 2018-04-02: 250 mg via INTRAMUSCULAR

## 2018-04-02 NOTE — Patient Instructions (Signed)
° ° ° °  If you have lab work done today you will be contacted with your lab results within the next 2 weeks.  If you have not heard from us then please contact us. The fastest way to get your results is to register for My Chart. ° ° °IF you received an x-ray today, you will receive an invoice from [facility]. Please contact [facility] at [phone] with questions or concerns regarding your invoice.  ° °IF you received labwork today, you will receive an invoice from [facility]. Please contact [facility] at [phone] with questions or concerns regarding your invoice.  ° °Our billing staff will not be able to assist you with questions regarding bills from these companies. ° °You will be contacted with the lab results as soon as they are available. The fastest way to get your results is to activate your My Chart account. Instructions are located on the last page of this paperwork. If you have not heard from us regarding the results in 2 weeks, please contact this office. °  ° ° ° °

## 2018-04-02 NOTE — Progress Notes (Signed)
Shay   

## 2018-04-02 NOTE — Progress Notes (Signed)
Go    04/02/2018 12:07 PM   DOB: 24-Sep-1965 / MRN: [id number]  SUBJECTIVE:  Stephen Patrick is a 52 y.o. male presenting for left sided lower abdominal pain. No fever, chills, nausea, normal non bloodly bowel movements.  No history of colonoscopy. He is concerned about STI today.  He had unprotected sex with a male about 2 weeks ago, not since, and after developed "white milky" discharge that has since improved.  No penile pain, testicular pain. No low back pain.  He has No Known Allergies.   He  has a past medical history of Hypertension and Thrombocytosis (Stephen Patrick).    He  reports that he has never smoked. He has never used smokeless tobacco. He reports that he does not drink alcohol or use drugs. He  reports that he does not engage in sexual activity. The patient  has no past surgical history on file.  His family history includes Cancer in his father.  Review of Systems  Constitutional: Negative for chills, diaphoresis and fever.  Eyes: Negative.   Respiratory: Negative for cough, hemoptysis, sputum production, shortness of breath and wheezing.   Cardiovascular: Negative for chest pain, orthopnea and leg swelling.  Gastrointestinal: Positive for abdominal pain. Negative for blood in stool, constipation, diarrhea, heartburn, melena, nausea and vomiting.  Skin: Negative for rash.  Neurological: Negative for dizziness, sensory change, speech change, focal weakness and headaches.    The problem list and medications were reviewed and updated by myself where necessary and exist elsewhere in the encounter.   OBJECTIVE:  BP (!) 144/94   Pulse 62   Temp 98.3 F (36.8 C)   Resp 16   Ht 6\' 2"  (1.88 m)   Wt 227 lb (103 kg)   SpO2 99%   BMI 29.15 kg/m   Wt Readings from Last 3 Encounters:  04/02/18 227 lb (103 kg)  01/25/18 233 lb 8 oz (105.9 kg)  03/08/17 237 lb 3.2 oz (107.6 kg)   Temp Readings from Last 3 Encounters:  04/02/18 98.3 F (36.8 C)  01/25/18 97.7 F (36.5 C) (Oral)   03/08/17 97.7 F (36.5 C) (Oral)   BP Readings from Last 3 Encounters:  04/02/18 (!) 144/94  01/25/18 136/73  03/08/17 136/70   Pulse Readings from Last 3 Encounters:  04/02/18 62  01/25/18 60  03/08/17 93    Physical Exam  Constitutional: He is active and cooperative.  Cardiovascular: Normal rate.  Pulmonary/Chest: Effort normal. No tachypnea.  Abdominal: Soft. Normal appearance and bowel sounds are normal. He exhibits no shifting dullness, no distension, no pulsatile liver, no fluid wave, no abdominal bruit, no ascites, no pulsatile midline mass and no mass. There is no hepatosplenomegaly, splenomegaly or hepatomegaly. There is no tenderness. There is no rigidity, no rebound, no guarding, no CVA tenderness, no tenderness at McBurney's point and negative Murphy's sign. No hernia. Hernia confirmed negative in the ventral area, confirmed negative in the right inguinal area and confirmed negative in the left inguinal area.  Genitourinary: Testes normal and penis normal. Right testis shows no mass, no swelling and no tenderness. Right testis is descended. Cremasteric reflex is not absent on the right side. Left testis shows no mass, no swelling and no tenderness. Left testis is descended. Cremasteric reflex is not absent on the left side. Circumcised. No penile tenderness. No discharge found.  Lymphadenopathy: No inguinal adenopathy noted on the right or left side.  Neurological: He is alert.  Skin: Skin is warm.  Vitals reviewed.   Lab  Results  Component Value Date   HGBA1C 5.9 (H) 01/25/2018    Lab Results  Component Value Date   WBC 6.2 01/25/2018   HGB 14.4 01/25/2018   HCT 44.7 01/25/2018   MCV 78 (L) 01/25/2018   PLT 746 (H) 01/25/2018    Lab Results  Component Value Date   CREATININE 1.29 (H) 01/25/2018   BUN 20 01/25/2018   NA 143 01/25/2018   K 4.7 01/25/2018   CL 104 01/25/2018   CO2 23 01/25/2018    Lab Results  Component Value Date   ALT 22 01/25/2018    AST 28 01/25/2018   ALKPHOS 67 01/25/2018   BILITOT 0.4 01/25/2018    Lab Results  Component Value Date   TSH 1.230 12/06/2016    Lab Results  Component Value Date   CHOL 178 01/25/2018   HDL 51 01/25/2018   LDLCALC 106 (H) 01/25/2018   TRIG 107 01/25/2018   CHOLHDL 3.5 01/25/2018     ASSESSMENT AND PLAN:  Stephen Patrick was seen today for abdominal pain and urinary tract infection.  Diagnoses and all orders for this visit:  Abnormal urine odor -     Cancel: Urinalysis, dipstick only -     Cancel: Microalbumin / creatinine urine ratio -     POCT urinalysis dipstick -     POCT Microscopic Urinalysis ([facility])  Penile discharge: Unprotected sex with new male partner with penile discharge starting about 1 week after that encounter.  Will cover for STI.   -     cefTRIAXone (ROCEPHIN) injection 250 mg -     azithromycin (ZITHROMAX) 250 MG tablet; Take four tabs once.  Left lower quadrant pain: Normal abdominal exam, no tachycardia with excellent bowel sounds. Watchful waiting.     The patient is advised to call or return to clinic if he does not see an improvement in symptoms, or to seek the care of the closest emergency department if he worsens with the above plan.   Philis Fendt, MHS, PA-C [facility] 04/02/2018 12:07 PM

## 2018-05-01 ENCOUNTER — Encounter: Payer: Self-pay | Admitting: Gastroenterology

## 2018-05-07 ENCOUNTER — Telehealth: Payer: Self-pay | Admitting: Emergency Medicine

## 2018-05-07 NOTE — Telephone Encounter (Signed)
Copied from [place] [id number]. 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

## 2018-05-08 ENCOUNTER — Other Ambulatory Visit: Payer: Self-pay

## 2018-05-08 ENCOUNTER — Ambulatory Visit (INDEPENDENT_AMBULATORY_CARE_PROVIDER_SITE_OTHER): Payer: 59 | Admitting: Family Medicine

## 2018-05-08 ENCOUNTER — Encounter: Payer: Self-pay | Admitting: Family Medicine

## 2018-05-08 VITALS — BP 142/86 | HR 66 | Temp 97.7°F | Resp 20 | Ht 74.61 in | Wt 233.6 lb

## 2018-05-08 DIAGNOSIS — I10 Essential (primary) hypertension: Secondary | ICD-10-CM

## 2018-05-08 DIAGNOSIS — N289 Disorder of kidney and ureter, unspecified: Secondary | ICD-10-CM

## 2018-05-08 MED ORDER — LISINOPRIL-HYDROCHLOROTHIAZIDE 20-25 MG PO TABS: 1.0000 | ORAL_TABLET | Freq: Every day | ORAL | 3 refills | Status: DC

## 2018-05-08 NOTE — Patient Instructions (Addendum)
  Drink lots of water.  Avoid excessive salt  Get daily exercise as discussed.  Increase the lisinopril HCT to 20/12.5.  You can double up on your current pills but when you get the new pills just take 1 daily.  Monitor your blood pressure, and if it is continuing to stay elevated come in for recheck.  Because of your previous borderline kidney function test, I recommend that you return in about 3 months to get your kidney function tests rechecked on the new medication.  Referral will be made to a cardiologist per your request.  Daneen Schick.   If you have lab work done today you will be contacted with your lab results within the next 2 weeks.  If you have not heard from [place] then please contact us. The fastest way to get your results is to register for My Chart.   IF you received an x-ray today, you will receive an invoice from [facility]. Please contact [facility] at [phone] with questions or concerns regarding your invoice.   IF you received labwork today, you will receive an invoice from [facility]. Please contact [facility] at [phone] with questions or concerns regarding your invoice.   Our billing staff will not be able to assist you with questions regarding bills from these companies.  You will be contacted with the lab results as soon as they are available. The fastest way to get your results is to activate your My Chart account. Instructions are located on the last page of this paperwork. If you have not heard from [place] regarding the results in 2 weeks, please contact this office.

## 2018-05-08 NOTE — Progress Notes (Signed)
Stephen Reason, MD 9/25/2019Patient ID: Stephen Patrick, male    DOB: 12-Jul-1966  Age: 52 y.o. MRN: [id number]  Chief Complaint  Patient presents with  . Medication Problem     pt woiuld like medication for b/p increase  . 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  Usually runs above 140.  He takes his lisinopril HCT daily.  He is not getting his much regular exercise as he should.  Reviewed his old chart and discussed labs and former blood pressures with him.  Current allergies, medications, problem list, past/family and social histories reviewed.  Objective:  BP (!) 142/86   Pulse 66   Temp 97.7 F (36.5 C) (Oral)   Resp 20   Ht 6' 2.61" (1.895 m)   Wt 233 lb 9.6 oz (106 kg)   SpO2 97%   BMI 29.51 kg/m   Alert and oriented.  Neck supple without nodes or thyromegaly.  Chest clear.  Heart regular without murmur.  Assessment & Plan:   Assessment: 1. Essential hypertension   2. Decreased renal function       Plan: Increase the lisinopril HCT.  If that does not do the job consider adding amlodipine.  See instructions.  Patient requested that he be referred to Dr. Daneen Schick, cardiologist, to manage his blood pressure.  I told him that a primary care could manage this level of blood pressure for him, but am happy to make the referral if he desires which he does.  Orders Placed This Encounter  Procedures  . Ambulatory referral to Cardiology    Referral Priority:   Routine    Referral Type:   Consultation    Referral Patrick:   Specialty Services Required    Referred to Provider:   Belva Crome, MD    Requested Specialty:   Cardiology    Number of Visits Requested:   1    Meds ordered this encounter  Medications  . lisinopril-hydrochlorothiazide (PRINZIDE,ZESTORETIC) 20-25 MG tablet    Sig: Take 1 tablet by mouth daily.    Dispense:  90 tablet    Refill:  3          Patient Instructions    Drink lots of water.  Avoid excessive salt  Get daily exercise as discussed.  Increase the lisinopril HCT to 20/12.5.  You can double up on your current pills but when you get the new pills just take 1 daily.  Monitor your blood pressure, and if it is continuing to stay elevated come in for recheck.  Because of your previous borderline kidney function test, I recommend that you return in about 3 months to get your kidney function tests rechecked on the new medication.  Referral will be made to a cardiologist per your request.  Daneen Schick.   If you have lab work done today you will be contacted with your lab results within the next 2 weeks.  If you have not heard from [place] then please contact us. The fastest way to get your results is to register for My Chart.   IF you received an x-ray today, you will receive an invoice from [facility]. Please contact [facility] at [phone] with questions or concerns regarding your invoice.   IF you received labwork today, you will receive an invoice from [place]. Please contact [facility] at [phone] with questions or concerns regarding your  invoice.   Our billing staff will not be able to assist you with questions regarding bills from these companies.  You will be contacted with the lab results as soon as they are available. The fastest way to get your results is to activate your My Chart account. Instructions are located on the last page of this paperwork. If you have not heard from [place] regarding the results in 2 weeks, please contact this office.        Return in about 3 months (around 08/07/2018).   Stephen Reason, MD 05/08/2018  Patient ID: Stephen Patrick, male    DOB: 07-Apr-1966  Age: 52 y.o. MRN: [id number]  Chief Complaint  Patient presents with  . Medication Problem     pt woiuld like medication for b/p increase  . Referral    cardiologist    Subjective:      Current allergies, medications, problem list, past/family and social histories reviewed.  Objective:  BP (!) 142/86   Pulse 66   Temp 97.7 F (36.5 C) (Oral)   Resp 20   Ht 6' 2.61" (1.895 m)   Wt 233 lb 9.6 oz (106 kg)   SpO2 97%   BMI 29.51 kg/m   Assessment & Plan:   Assessment: 1. Essential hypertension   2. Decreased renal function       Plan:   Orders Placed This Encounter  Procedures  . Ambulatory referral to Cardiology    Referral Priority:   Routine    Referral Type:   Consultation    Referral Patrick:   Specialty Services Required    Referred to Provider:   Belva Crome, MD    Requested Specialty:   Cardiology    Number of Visits Requested:   1    Meds ordered this encounter  Medications  . lisinopril-hydrochlorothiazide (PRINZIDE,ZESTORETIC) 20-25 MG tablet    Sig: Take 1 tablet by mouth daily.    Dispense:  90 tablet    Refill:  3         Patient Instructions    Drink lots of water.  Avoid excessive salt  Get daily exercise as discussed.  Increase the lisinopril HCT to 20/12.5.  You can double up on your current pills but when you get the new pills just take 1 daily.  Monitor your blood pressure, and if it is continuing to stay elevated come in for recheck.  Because of your previous borderline kidney function test, I recommend that you return in about 3 months to get your kidney function tests rechecked on the new medication.  Referral will be made to a cardiologist per your request.  Daneen Schick.   If you have lab work done today you will be contacted with your lab results within the next 2 weeks.  If you have not heard from [place] then please contact us. The fastest way to get your results is to register for My Chart.   IF you received an x-ray today, you will receive an invoice from [facility]. Please contact [facility] at [phone] with questions or concerns regarding your invoice.   IF you received  labwork today, you will receive an invoice from [place]. Please contact [facility] at [phone] with questions or concerns regarding your invoice.   Our billing staff will not be able to assist you with questions regarding bills from these companies.  You will be contacted with the lab results as soon as they are available. The fastest way to get your results is  to activate your My Chart account. Instructions are located on the last page of this paperwork. If you have not heard from [place] regarding the results in 2 weeks, please contact this office.        Return in about 3 months (around 08/07/2018).   Stephen Reason, MD 05/08/2018

## 2018-05-08 NOTE — Telephone Encounter (Signed)
Patient has appointment today

## 2018-05-08 NOTE — Progress Notes (Signed)
Patient ID: Stephen Patrick, male    DOB: 03-28-1966  Age: 52 y.o. MRN: [id number]  Chief Complaint  Patient presents with  . Medication Problem     pt woiuld like medication for b/p increase  . Referral    cardiologist    Subjective:    Current allergies, medications, problem list, past/family and social histories reviewed.  Objective:    Assessment & Plan:   Assessment: No diagnosis found.    Plan:   No orders of the defined types were placed in this encounter.   No orders of the defined types were placed in this encounter.        Patient Instructions       If you have lab work done today you will be contacted with your lab results within the next 2 weeks.  If you have not heard from [place] then please contact us. The fastest way to get your results is to register for My Chart.   IF you received an x-ray today, you will receive an invoice from [facility]. Please contact [facility] at [phone] with questions or concerns regarding your invoice.   IF you received labwork today, you will receive an invoice from [place]. Please contact [facility] at [phone] with questions or concerns regarding your invoice.   Our billing staff will not be able to assist you with questions regarding bills from these companies.  You will be contacted with the lab results as soon as they are available. The fastest way to get your results is to activate your My Chart account. Instructions are located on the last page of this paperwork. If you have not heard from [place] regarding the results in 2 weeks, please contact this office.        No follow-ups on file.   Stephen Reason, MD 05/08/2018

## 2018-06-10 ENCOUNTER — Encounter: Payer: Self-pay | Admitting: Gastroenterology

## 2018-08-05 ENCOUNTER — Telehealth: Payer: Self-pay | Admitting: Family Medicine

## 2018-08-05 ENCOUNTER — Encounter: Payer: Self-pay | Admitting: Family Medicine

## 2018-08-05 ENCOUNTER — Other Ambulatory Visit: Payer: Self-pay

## 2018-08-05 ENCOUNTER — Ambulatory Visit: Payer: Self-pay | Admitting: Family Medicine

## 2018-08-05 VITALS — BP 138/72 | HR 80 | Temp 98.0°F | Resp 16 | Ht 74.21 in | Wt 230.0 lb

## 2018-08-05 DIAGNOSIS — Z1589 Genetic susceptibility to other disease: Secondary | ICD-10-CM

## 2018-08-05 DIAGNOSIS — Z024 Encounter for examination for driving license: Secondary | ICD-10-CM

## 2018-08-05 DIAGNOSIS — D473 Essential (hemorrhagic) thrombocythemia: Secondary | ICD-10-CM

## 2018-08-05 DIAGNOSIS — D75839 Thrombocytosis, unspecified: Secondary | ICD-10-CM

## 2018-08-05 MED ORDER — OLMESARTAN MEDOXOMIL-HCTZ 40-25 MG PO TABS: 1.0000 | ORAL_TABLET | Freq: Every day | ORAL | 1 refills | Status: DC

## 2018-08-05 MED ORDER — ASPIRIN EC 325 MG PO TBEC: 325.0000 mg | DELAYED_RELEASE_TABLET | Freq: Every day | ORAL | 0 refills | Status: DC

## 2018-08-05 NOTE — Progress Notes (Signed)
This patient presents for DOT examination for fitness for duty.   Medical History:  1. Head/Brain Injuries, disorders or illnesses no  2. Seizures, epilepsy no  3. Eye disorders or impaired vision (except corrective lenses) no  4. Ear disorders, loss of hearing or balance no  5. Heart disease or heart attack, other cardiovascular condition no  6. Heart surgery (valve replacement/bypass, angioplasty, pacemaker/defribrillator) no  7. High blood pressure yes  8. High cholesterol no  9. Chronic cough, shortness of breath or other breathing problems no  10. Lung disease (emphysema, asthma or chronic bronchitis) no  11. Kidney disease, dialysis no  12. Digestive problems  no  13. Diabetes or elevated blood sugar no                      If yes to #13, Insulin use no  14. Nervious or psychiatric disorders, e.g., severe depression no  15. Fainting or syncope no  16. Dizziness, headaches, numbness, tingling or memory loss no  17. Unexplained weight loss no  18. Stroke, TIA or paralysis no  19. Missing or impaired hand, arm, foot, leg, finger, toe no  20. Spinal injury or disease no  21. Bone, muscles or nerve problems no  22. Blood clots or bleeding bleeding disorders no  23. Cancer no  24. Chronic infection or other chronic diseases no  25. Sleep disorders, pauses in breathing while asleep, daytime sleepiness, loud snoring no  26. Have you ever had a sleep test? no  27.  Have you ever spent a night in the hospital? no  28. Have you ever had a broken bone? no  29. Have you or or do you use tobacco products? no  30. Regular, frequent alcohol use no  31. Illegal substance use within the past 2 years no  32.  Have you ever failed a drug test or been dependent on an illegal substance? no   Current Medications: Prior to Admission medications   Medication Sig Start Date End Date Taking? Authorizing Provider  lisinopril-hydrochlorothiazide (PRINZIDE,ZESTORETIC) 20-25 MG tablet Take 1  tablet by mouth daily. 05/08/18  Yes Posey Boyer, MD    Medical Examiner's Comments on Health History:  HTN: BP 136/81, BP 147/88 - has been fluctuating.  Is a long-distance truck   TESTING:   Visual Acuity Screening   Right eye Left eye Both eyes  Without correction: 20/20 20/20 20/20   With correction:     Comments: The patient can distinguish the colors red, amber and green.  Peripheral Vision: Right eye 85 degrees. Left eye 85 degrees.  Hearing Screening Comments: The patient was able to hear a forced whisper from 6 feet.  Monocular Vision: No.  Hearing Aid used for test: Yes.   Hearing Aid required to to meet standard: No.  BP 138/72   Pulse 80   Temp 98 F (36.7 C) (Oral)   Resp 16   Ht 6' 2.21" (1.885 m)   Wt 230 lb (104.3 kg)   SpO2 98%   BMI 29.36 kg/m  Pulse rate is regular  Comments: SG: 1.010  Pro: Neg Blo: Neg  Glu:Neg  PHYSICAL EXAMINATION:  General Appearance Not markedly obese. No tremor, signs of alcoholism, problem drinking or drug abuse.   Skin Warm, dry and intact. Scars (none noted), Tattoos (none noted)  Eyes Pupils are equal, round and reactive to light and accommodation, extraocular movements are intact. No exophthalmos, no nystagmus. Evidence of cataracts, retinopathy,  macular degeneration, aphakia, glaucoma may need referral to a specialist.  Ears Normal external ears. External canal without occlusion. No scarring of the TM. No perforation of the TM.  Mouth and Throat Clear and moist. No irremedial deformities likely to interfere with breathing or swallowing.  Heart No murmurs, extra sounds, evidence of cardiomegaly. No pacemaker. No implantable defibrillator.  Lungs and Chest (excluding breasts) Normal chest expansion, respiratory rate, breath sounds. No cyanosis.  Abdomen and Viscera No liver enlargement. No splenic enlargement. No masses, bruits, hernias or significant abdominal wall weakness.  Genitourinary  Deferred as no sxs, signs,  complaints/concerns  Spine and other musculoskeletal No tenderness, no limitation of motion, no deformities. no evidence of previous surgery.  Extremities No loss or impairment of leg, foot, toe, arm, hand, finger. No perceptible limp, deformities, atrophy, weakness, paralysis, clubbing, edema, hypotonia. Patient has sufficient grasp and prehension to maintain steering wheel grip. Patient has sufficient mobility and strength in the lower limbs to operate pedals properly.  Neurologic Normal equilibrium, coordination, speech pattern. No paresthesia, asymmetry of deep tendon reflexes, sensory or positional abnormalities. No abnormality of patellar or Babinski's reflexes.  Gait Not antalgic or ataxic  Vascular Normal pulses. No carotid or arterial bruits. No varicose veins.    Does not meet standards. Certification Status: does not meet standards for 2 year certificate. 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

## 2018-08-05 NOTE — Addendum Note (Signed)
Addended by: Shawnee Knapp on: 08/05/2018 06:40 PM   Modules accepted: Orders

## 2018-08-05 NOTE — Telephone Encounter (Addendum)
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  Checks his blood pressure at home and states it runs 130s to 140s over 70s to 80s with no decrease in these numbers since the dose increase on his BP med.  Advised DC lisinopril HCTZ 20-25 and start more effective olmesartan HCTZ 40-25.  Return to clinic in 1 month for BP check and BMP since last creatinine was just slightly higher than ones prior but now was 6 months ago so should be read checked when changing acei to arb. eGFR 73  Patient also noted that he has a history of chronic thrombocytosis - plts 746 at most recent draw which is lowest they have been over the past 2 years - have been up to 958 03/2016.  States he had been discussed about having hematology evaluation prior but this never happened.  Advised that platelets have been stable over the past several years.  However review of the chart shows patient actually was seen by Dr. Payton Mccallum on 03/30/2016 who did extensive blood work to evaluate for primary versus secondary thrombocytosis and the potential etiologies of both.  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 This JAK2 V617F mutation can be seen in a variety of BCR/ABL negative chronic myeloproliferative disorders of the bone marrow including essential thrombocythemia, chronic idiopathic myelofibrosis and less frequently chronic myelomonocytic leukemia (CMML), atypical Ph-CML, chronic neutrophilic leukemia and others.   Additional testing for secondary causes of infections, inflammation, iron deficiency was done (CRP, IRON PANEL, FERRITIN) and were normal/negative.   Patient was  supposed to follow-up in 2 weeks to review the results and receive recommendations on further evaluation or treatment which he never did. Dr. Lindi Adie noted that if this was primary essential thrombocytosis - which seems most likely with the +JAK2 V617F mutation but neg BRC/ABL1 gene - pt is still in the low risk category as his platelets are <1000 and he does not have any history of blood clots and so treatment would be aspirin therapy.  Patient becomes high risk if platelet count rise to be greater than 1000 or he ever develops a blood clot in which case he needs to be on platelet lowering therapy in addition to aspirin.  Fortunately so far patient has remained in the low risk category but is not taking aspirin.  Pt's Problem List shows that after the labs came back, Dr. Lindi Adie was going to recommend pt be started on anagrelide, rechecked in 2 mos w/ goal of decreasing plt count to <450K. I called pt - he was unaware of any diagnosis, lab results, or need for treatment. Reviewed sxs/risks/complications from untreated essential thrombocythemia - pt endorses burning/numbness in his palms and soles - feet worse than hands but no other sxs.  Agrees to start asa '325mg'$  qd (denies any GI/gastritis/PUD/GERD hx) and make appt w/ hematology when they call him as I placed referral back to Dr. Lindi Adie. Requests that info on likely diagnosis of essential thrombocythemia be sent to his email at [email or web address] which I did.

## 2018-08-05 NOTE — Patient Instructions (Signed)
° ° ° °  If you have lab work done today you will be contacted with your lab results within the next 2 weeks.  If you have not heard from us then please contact us. The fastest way to get your results is to register for My Chart. ° ° °IF you received an x-ray today, you will receive an invoice from [facility]. Please contact [facility] at [phone] with questions or concerns regarding your invoice.  ° °IF you received labwork today, you will receive an invoice from [facility]. Please contact [facility] at [phone] with questions or concerns regarding your invoice.  ° °Our billing staff will not be able to assist you with questions regarding bills from these companies. ° °You will be contacted with the lab results as soon as they are available. The fastest way to get your results is to activate your My Chart account. Instructions are located on the last page of this paperwork. If you have not heard from us regarding the results in 2 weeks, please contact this office. °  ° ° ° °

## 2018-08-16 ENCOUNTER — Telehealth: Payer: Self-pay | Admitting: Hematology and Oncology

## 2018-08-16 NOTE — Telephone Encounter (Signed)
Not able to reach patient or leave message re f/u per 12/31 schedule message. Schedule mailed.

## 2018-08-27 ENCOUNTER — Inpatient Hospital Stay: Payer: 59 | Attending: Hematology and Oncology | Admitting: Hematology and Oncology

## 2018-08-27 NOTE — Assessment & Plan Note (Signed)
Essential Thrombocytosis: JAK-2 Mutation Positive Lab Review:  Treatment Considerations: Treatment depends on platelet count level as well as history of thrombosis.  A. For low risk patients, (platelet counts less than 1000 and no history of blood clots) the treatment would be with aspirin therapy B. for high risk patients(platelet counts greater than 1000/history of blood clot) the treatment would be platelet lowering therapy with aspirin  Patient is low risk based on these criteria. Treatment Plan: Aspirin Platelet count: 746 June 2019  RTC in 6 months with labs and follow up.

## 2019-03-06 ENCOUNTER — Other Ambulatory Visit: Payer: Self-pay

## 2019-03-06 MED ORDER — OLMESARTAN MEDOXOMIL-HCTZ 40-25 MG PO TABS: 1.0000 | ORAL_TABLET | Freq: Every day | ORAL | 0 refills | Status: DC

## 2019-06-05 ENCOUNTER — Encounter (INDEPENDENT_AMBULATORY_CARE_PROVIDER_SITE_OTHER): Payer: Self-pay | Admitting: Primary Care

## 2019-06-05 ENCOUNTER — Other Ambulatory Visit: Payer: Self-pay

## 2019-06-05 ENCOUNTER — Ambulatory Visit (INDEPENDENT_AMBULATORY_CARE_PROVIDER_SITE_OTHER): Payer: 59 | Admitting: Primary Care

## 2019-06-05 VITALS — BP 137/83 | HR 93 | Temp 97.3°F | Ht 74.0 in | Wt 242.2 lb

## 2019-06-05 DIAGNOSIS — R030 Elevated blood-pressure reading, without diagnosis of hypertension: Secondary | ICD-10-CM

## 2019-06-05 DIAGNOSIS — E669 Obesity, unspecified: Secondary | ICD-10-CM

## 2019-06-05 DIAGNOSIS — R7303 Prediabetes: Secondary | ICD-10-CM

## 2019-06-05 DIAGNOSIS — Z131 Encounter for screening for diabetes mellitus: Secondary | ICD-10-CM

## 2019-06-05 DIAGNOSIS — Z1211 Encounter for screening for malignant neoplasm of colon: Secondary | ICD-10-CM

## 2019-06-05 DIAGNOSIS — Z6831 Body mass index (BMI) 31.0-31.9, adult: Secondary | ICD-10-CM

## 2019-06-05 DIAGNOSIS — Z7689 Persons encountering health services in other specified circumstances: Secondary | ICD-10-CM

## 2019-06-05 LAB — POCT GLYCOSYLATED HEMOGLOBIN (HGB A1C): Hemoglobin A1C: 5.6 % (ref 4.0–5.6)

## 2019-06-05 NOTE — Progress Notes (Signed)
Established Patient Office Visit  Subjective:  Patient ID: Stephen Patrick, male    DOB: 04-02-1966  Age: 53 y.o. MRN: [id number]  CC:  Chief Complaint  Patient presents with  . New Patient (Initial Visit)    HPI Stephen Patrick presents for establishment of care. Also , wanting to get up to date on his health maintenance..  Past Medical History:  Diagnosis Date  . Hypertension   . Thrombocytosis ([place])     No past surgical history on file.  Family History  Problem Relation Age of Onset  . Cancer Father     Social History   Socioeconomic History  . Marital status: Married    Spouse name: Not on file  . Number of children: 4  . Years of education: Not on file  . Highest education level: Not on file  Occupational History  . Not on file  Social Needs  . Financial resource strain: Not on file  . Food insecurity    Worry: Not on file    Inability: Not on file  . Transportation needs    Medical: Not on file    Non-medical: Not on file  Tobacco Use  . Smoking status: Never Smoker  . Smokeless tobacco: Never Used  Substance and Sexual Activity  . Alcohol use: No  . Drug use: No  . Sexual activity: Yes    Birth control/protection: Condom  Lifestyle  . Physical activity    Days per week: Not on file    Minutes per session: Not on file  . Stress: Not on file  Relationships  . Social Herbalist on phone: Not on file    Gets together: Not on file    Attends religious service: Not on file    Active member of club or organization: Not on file    Attends meetings of clubs or organizations: Not on file    Relationship status: Not on file  . Intimate partner violence    Fear of current or ex partner: Not on file    Emotionally abused: Not on file    Physically abused: Not on file    Forced sexual activity: Not on file  Other Topics Concern  . Not on file  Social History Narrative  . Not on file    Outpatient Medications Prior to Visit  Medication  Sig Dispense Refill  . aspirin EC 325 MG tablet Take 1 tablet (325 mg total) by mouth daily. 356 tablet 0  . olmesartan-hydrochlorothiazide (BENICAR HCT) 40-25 MG tablet Take 1 tablet by mouth daily. 90 tablet 0   No facility-administered medications prior to visit.     No Known Allergies  ROS Review of Systems  All other systems reviewed and are negative.     Objective:    Physical Exam  Constitutional: He is oriented to person, place, and time. He appears well-developed and well-nourished.  HENT:  Head: Normocephalic and atraumatic.  Eyes: Pupils are equal, round, and reactive to light. EOM are normal.  Neck: Normal range of motion. Neck supple.  Cardiovascular: Normal rate and regular rhythm.  Pulmonary/Chest: Effort normal and breath sounds normal.  Abdominal: Soft. Bowel sounds are normal.  Musculoskeletal: Normal range of motion.  Neurological: He is alert and oriented to person, place, and time.  Skin: Skin is warm and dry.  Psychiatric: He has a normal mood and affect.    BP 137/83 (BP Location: Left Arm, Patient Position: Sitting, Cuff Size: Large)   Pulse  93   Temp (!) 97.3 F (36.3 C) (Temporal)   Ht 6\' 2"  (1.88 m)   Wt 242 lb 3.2 oz (109.9 kg)   SpO2 96%   BMI 31.10 kg/m  Wt Readings from Last 3 Encounters:  06/05/19 242 lb 3.2 oz (109.9 kg)  08/05/18 230 lb (104.3 kg)  05/08/18 233 lb 9.6 oz (106 kg)     Health Maintenance Due  Topic Date Due  . COLONOSCOPY  06/08/2014    There are no preventive care reminders to display for this patient.  Lab Results  Component Value Date   TSH 1.230 12/06/2016   Lab Results  Component Value Date   WBC 6.2 01/25/2018   HGB 14.4 01/25/2018   HCT 44.7 01/25/2018   MCV 78 (L) 01/25/2018   PLT 746 (H) 01/25/2018   Lab Results  Component Value Date   NA 143 01/25/2018   K 4.7 01/25/2018   CO2 23 01/25/2018   GLUCOSE 88 01/25/2018   BUN 20 01/25/2018   CREATININE 1.29 (H) 01/25/2018   BILITOT 0.4  01/25/2018   ALKPHOS 67 01/25/2018   AST 28 01/25/2018   ALT 22 01/25/2018   PROT 6.8 01/25/2018   ALBUMIN 4.6 01/25/2018   CALCIUM 9.5 01/25/2018   ANIONGAP 6 03/13/2016   Lab Results  Component Value Date   CHOL 178 01/25/2018   Lab Results  Component Value Date   HDL 51 01/25/2018   Lab Results  Component Value Date   LDLCALC 106 (H) 01/25/2018   Lab Results  Component Value Date   TRIG 107 01/25/2018   Lab Results  Component Value Date   CHOLHDL 3.5 01/25/2018   Lab Results  Component Value Date   HGBA1C 5.6 06/05/2019      Assessment & Plan:  Stephen Patrick was seen today for new patient (initial visit).  Diagnoses and all orders for this visit:  Encounter to establish care Stephen Mire, NP-C will be your  (PCP) that will  provides both the first contact for a person with an undiagnosed health concern as well as continuing care of varied medical conditions, not limited by cause, organ system, or diagnosis.   Special screening for malignant neoplasms, colon -     Fecal occult blood, imunochemical; Future  Screening for diabetes  [place] Discuss prediabetes is 5.7-6.4 at this time he is not a prediabetic encourage decreasing carbohydrates such as rice, potatoes, breads, sweets and sodas.  Lifestyle modifications can decrease or prevent having prediabetes which may lead to type 2 diabetes. -     HgB A1c 5.6   BMI 31.0-31.9,adult Discussed weight BMI indicating obesity is 30-39 which may be from excess in caloric intake or underlining conditions. This may lead to other co-morbidities such as diabetes. Lifestyle modifications of diet and exercise may reduce obesity and hypertension.  Elevated blood pressure reading in office without diagnosis of hypertension Blood pressure today 137/83 this is patient's first visit guidelines are diagnosis of hypertension 130/80 not on first encounter will monitor blood pressure on follow-up visits to determine if medication is  required.  At this time patient was counseled on diet modifications to include decreasing sodium intake alternate uses herbs, Mrs. Dash, and iron or garlic powder.  Previously discussed exercise and this will also assist in lowering blood pressure. No orders of the defined types were placed in this encounter.   Follow-up: Return if symptoms worsen or fail to improve.    Kerin Perna, NP

## 2019-06-05 NOTE — Patient Instructions (Signed)

## 2019-06-06 ENCOUNTER — Other Ambulatory Visit (INDEPENDENT_AMBULATORY_CARE_PROVIDER_SITE_OTHER): Payer: 59

## 2019-06-09 ENCOUNTER — Other Ambulatory Visit (INDEPENDENT_AMBULATORY_CARE_PROVIDER_SITE_OTHER): Payer: 59

## 2019-06-09 ENCOUNTER — Other Ambulatory Visit: Payer: Self-pay

## 2019-06-10 LAB — CMP14+EGFR
ALT: 46 IU/L — ABNORMAL HIGH (ref 0–44)
AST: 34 IU/L (ref 0–40)
Albumin/Globulin Ratio: 2 (ref 1.2–2.2)
Albumin: 4.3 g/dL (ref 3.8–4.9)
Alkaline Phosphatase: 62 IU/L (ref 39–117)
BUN/Creatinine Ratio: 12 (ref 9–20)
BUN: 18 mg/dL (ref 6–24)
Bilirubin Total: 0.4 mg/dL (ref 0.0–1.2)
CO2: 24 mmol/L (ref 20–29)
Calcium: 9.5 mg/dL (ref 8.7–10.2)
Chloride: 100 mmol/L (ref 96–106)
Creatinine, Ser: 1.51 mg/dL — ABNORMAL HIGH (ref 0.76–1.27)
GFR calc Af Amer: 59 mL/min/{1.73_m2} — ABNORMAL LOW (ref >59)
GFR calc non Af Amer: 51 mL/min/{1.73_m2} — ABNORMAL LOW (ref >59)
Globulin, Total: 2.1 g/dL (ref 1.5–4.5)
Glucose: 89 mg/dL (ref 65–99)
Potassium: 4.6 mmol/L (ref 3.5–5.2)
Sodium: 139 mmol/L (ref 134–144)
Total Protein: 6.4 g/dL (ref 6.0–8.5)

## 2019-06-10 LAB — CBC WITH DIFFERENTIAL/PLATELET
Basophils Absolute: 0.1 10*3/uL (ref 0.0–0.2)
Basos: 1 %
EOS (ABSOLUTE): 0.2 10*3/uL (ref 0.0–0.4)
Eos: 3 %
Hematocrit: 44.9 % (ref 37.5–51.0)
Hemoglobin: 14 g/dL (ref 13.0–17.7)
Immature Grans (Abs): 0 10*3/uL (ref 0.0–0.1)
Immature Granulocytes: 0 %
Lymphocytes Absolute: 2.4 10*3/uL (ref 0.7–3.1)
Lymphs: 34 %
MCH: 24.9 pg — ABNORMAL LOW (ref 26.6–33.0)
MCHC: 31.2 g/dL — ABNORMAL LOW (ref 31.5–35.7)
MCV: 80 fL (ref 79–97)
Monocytes Absolute: 0.6 10*3/uL (ref 0.1–0.9)
Monocytes: 9 %
Neutrophils Absolute: 3.8 10*3/uL (ref 1.4–7.0)
Neutrophils: 53 %
Platelets: 822 10*3/uL (ref 150–450)
RBC: 5.62 x10E6/uL (ref 4.14–5.80)
RDW: 14.4 % (ref 11.6–15.4)
WBC: 7.2 10*3/uL (ref 3.4–10.8)

## 2019-06-10 LAB — LIPID PANEL
Chol/HDL Ratio: 3.3 ratio (ref 0.0–5.0)
Cholesterol, Total: 172 mg/dL (ref 100–199)
HDL: 52 mg/dL (ref >39)
LDL Chol Calc (NIH): 106 mg/dL — ABNORMAL HIGH (ref 0–99)
Triglycerides: 75 mg/dL (ref 0–149)
VLDL Cholesterol Cal: 14 mg/dL (ref 5–40)

## 2019-06-10 LAB — FECAL OCCULT BLOOD, IMMUNOCHEMICAL: Fecal Occult Bld: NEGATIVE

## 2019-07-01 ENCOUNTER — Ambulatory Visit: Payer: 59 | Admitting: Emergency Medicine

## 2019-07-09 ENCOUNTER — Ambulatory Visit: Payer: 59 | Admitting: Emergency Medicine

## 2019-08-17 ENCOUNTER — Other Ambulatory Visit: Payer: Self-pay | Admitting: Emergency Medicine

## 2019-08-19 ENCOUNTER — Telehealth (INDEPENDENT_AMBULATORY_CARE_PROVIDER_SITE_OTHER): Payer: Self-pay

## 2019-08-19 NOTE — Telephone Encounter (Signed)
Patient called to request refill of olmesartan-hydrochlorothiazide sent to [facility]. Nat Christen, CMA

## 2019-08-20 ENCOUNTER — Other Ambulatory Visit (INDEPENDENT_AMBULATORY_CARE_PROVIDER_SITE_OTHER): Payer: Self-pay | Admitting: Primary Care

## 2019-08-20 MED ORDER — OLMESARTAN MEDOXOMIL-HCTZ 40-25 MG PO TABS: 1.0000 | ORAL_TABLET | Freq: Every day | ORAL | 1 refills | Status: DC

## 2019-11-07 ENCOUNTER — Telehealth (INDEPENDENT_AMBULATORY_CARE_PROVIDER_SITE_OTHER): Payer: Self-pay

## 2019-11-07 NOTE — Telephone Encounter (Signed)
Patient called requesting a refill for olmesartan-hydrochlorothiazide (BENICAR HCT) 40-25 MG tablet  States he has 2 weeks left of medication but does not seem to find his bottle. 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 Patient was advice he has 1 refill for 90 days and would possible pay out of pocket. Patient voiced understanding and agreed to call pharmacy to request refill. Patient will call back if he has trouble refilling his medication.  Please [phone]

## 2019-11-13 ENCOUNTER — Ambulatory Visit: Payer: 59

## 2020-02-04 ENCOUNTER — Other Ambulatory Visit (INDEPENDENT_AMBULATORY_CARE_PROVIDER_SITE_OTHER): Payer: Self-pay | Admitting: Primary Care

## 2020-02-04 NOTE — Telephone Encounter (Signed)
Sent to PCP ?

## 2020-02-10 ENCOUNTER — Other Ambulatory Visit (INDEPENDENT_AMBULATORY_CARE_PROVIDER_SITE_OTHER): Payer: Self-pay | Admitting: Primary Care

## 2020-02-10 ENCOUNTER — Telehealth (INDEPENDENT_AMBULATORY_CARE_PROVIDER_SITE_OTHER): Payer: Self-pay

## 2020-02-10 DIAGNOSIS — Z1211 Encounter for screening for malignant neoplasm of colon: Secondary | ICD-10-CM

## 2020-02-10 NOTE — Telephone Encounter (Signed)
Patient called to request a referral to a gastroenterologist to have a coloscopy done.  Patient states he had a referral place almost 2 or 3 years ago but was not able to schedule an appointment due to his work schedule.  Please referral if appropriate.

## 2020-03-01 ENCOUNTER — Other Ambulatory Visit (INDEPENDENT_AMBULATORY_CARE_PROVIDER_SITE_OTHER): Payer: Self-pay | Admitting: Primary Care

## 2020-03-01 MED ORDER — OLMESARTAN MEDOXOMIL-HCTZ 40-25 MG PO TABS: 1.0000 | ORAL_TABLET | Freq: Every day | ORAL | 0 refills | Status: DC

## 2020-03-01 NOTE — Telephone Encounter (Signed)
Requested medication (s) are due for refill today:yes  Requested medication (s) are on the active medication list: yes  Last refill:  08/20/2019  Future visit scheduled: no  Notes to clinic:  Patient is due for a follow up and will contact office to schedule   Requested Prescriptions  Pending Prescriptions Disp Refills   olmesartan-hydrochlorothiazide (BENICAR HCT) 40-25 MG tablet 90 tablet 1    Sig: Take 1 tablet by mouth daily.      Cardiovascular: ARB + Diuretic Combos Failed - 03/01/2020  9:37 AM      Failed - K in normal range and within 180 days    Potassium  Date Value Ref Range Status  06/09/2019 4.6 3.5 - 5.2 mmol/L Final          Failed - Na in normal range and within 180 days    Sodium  Date Value Ref Range Status  06/09/2019 139 134 - 144 mmol/L Final          Failed - Cr in normal range and within 180 days    Creatinine, Ser  Date Value Ref Range Status  06/09/2019 1.51 (H) 0.76 - 1.27 mg/dL Final          Failed - Ca in normal range and within 180 days    Calcium  Date Value Ref Range Status  06/09/2019 9.5 8.7 - 10.2 mg/dL Final          Failed - Valid encounter within last 6 months    Recent Outpatient Visits           9 months ago Encounter to establish care   Louise, Ona, NP   1 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

## 2020-03-01 NOTE — Telephone Encounter (Signed)
Medication Refill - Medication: olmesartan-hydrochlorothiazide (BENICAR HCT) 40-25 MG tablet   Please call pt when sent   Has the patient contacted their pharmacy? Yes.   (Agent: If no, request that the patient contact the pharmacy for the refill.) (Agent: If yes, when and what did the pharmacy advise?)  Preferred Pharmacy (with phone number or street name):  [facility], [place] Phone:  [phone]  Fax:  [phone]        Agent: Please be advised that RX refills may take up to 3 business days. We ask that you follow-up with your pharmacy.

## 2020-03-08 ENCOUNTER — Ambulatory Visit (INDEPENDENT_AMBULATORY_CARE_PROVIDER_SITE_OTHER): Payer: 59 | Admitting: Primary Care

## 2020-04-06 ENCOUNTER — Other Ambulatory Visit: Payer: Self-pay

## 2020-04-06 ENCOUNTER — Ambulatory Visit (AMBULATORY_SURGERY_CENTER): Payer: Self-pay

## 2020-04-06 ENCOUNTER — Encounter: Payer: Self-pay | Admitting: Gastroenterology

## 2020-04-06 VITALS — Ht 74.0 in | Wt 246.0 lb

## 2020-04-06 DIAGNOSIS — Z1211 Encounter for screening for malignant neoplasm of colon: Secondary | ICD-10-CM

## 2020-04-06 DIAGNOSIS — Z800 Family history of malignant neoplasm of digestive organs: Secondary | ICD-10-CM

## 2020-04-06 NOTE — Progress Notes (Signed)
No egg or soy allergy known to patient  No issues with past sedation with any surgeries or procedures No intubation problems in the past  No FH of Malignant Hyperthermia No diet pills per patient No home 02 use per patient  No blood thinners per patient  Pt denies issues with constipation  No A fib or A flutter  EMMI video to pt COVID 19 guidelines implemented in [facility] today with Pt and RN  COVID vaccines completed on 12/2019 per pt;  Due to the COVID-19 pandemic we are asking patients to follow these guidelines. Please only bring one care partner. Please be aware that your care partner may wait in the car in the parking lot or if they feel like they will be too hot to wait in the car, they may wait in the lobby on the 4th floor. All care partners are required to wear a mask the entire time (we do not have any that we can provide them), they need to practice social distancing, and we will do a Covid check for all patient's and care partners when you arrive. Also we will check their temperature and your temperature. If the care partner waits in their car they need to stay in the parking lot the entire time and we will call them on their cell phone when the patient is ready for discharge so they can bring the car to the front of the building. Also all patient's will need to wear a mask into building.

## 2020-04-20 ENCOUNTER — Encounter: Payer: 59 | Admitting: Gastroenterology

## 2020-07-29 ENCOUNTER — Encounter (INDEPENDENT_AMBULATORY_CARE_PROVIDER_SITE_OTHER): Payer: Self-pay

## 2020-07-29 ENCOUNTER — Ambulatory Visit (INDEPENDENT_AMBULATORY_CARE_PROVIDER_SITE_OTHER): Payer: 59 | Admitting: Primary Care

## 2020-07-29 ENCOUNTER — Encounter (INDEPENDENT_AMBULATORY_CARE_PROVIDER_SITE_OTHER): Payer: Self-pay | Admitting: Primary Care

## 2020-07-29 VITALS — BP 141/95 | HR 72 | Temp 97.5°F | Ht 74.0 in | Wt 233.6 lb

## 2020-07-29 DIAGNOSIS — I10 Essential (primary) hypertension: Secondary | ICD-10-CM

## 2020-07-29 DIAGNOSIS — Z13220 Encounter for screening for lipoid disorders: Secondary | ICD-10-CM | POA: Diagnosis not present

## 2020-07-29 DIAGNOSIS — Z125 Encounter for screening for malignant neoplasm of prostate: Secondary | ICD-10-CM | POA: Diagnosis not present

## 2020-07-29 MED ORDER — LISINOPRIL-HYDROCHLOROTHIAZIDE 20-25 MG PO TABS: 1.0000 | ORAL_TABLET | Freq: Every morning | ORAL | 1 refills | Status: DC

## 2020-07-29 MED ORDER — AMLODIPINE BESYLATE 10 MG PO TABS: 10.0000 mg | ORAL_TABLET | Freq: Every day | ORAL | 3 refills | Status: DC

## 2020-07-29 NOTE — Patient Instructions (Signed)

## 2020-07-29 NOTE — Progress Notes (Signed)
Established Patient Office Visit  Subjective:  Patient ID: Stephen Patrick, male    DOB: 09-Jul-1966  Age: 54 y.o. MRN: [id number]  CC:  Chief Complaint  Patient presents with  . medication review     HPI Mr Stephen Patrick is a 54 year old male who presents for management of HTN. Denies shortness of breath, headaches, chest pain or lower extremity edema. Requesting refills   Past Medical History:  Diagnosis Date  . Hyperlipidemia    on meds  . Hypertension    on meds  . Thrombocytosis     Past Surgical History:  Procedure Laterality Date  . WISDOM TOOTH EXTRACTION  2005    Family History  Problem Relation Age of Onset  . Colon polyps Mother 64  . Colon polyps Father   . Colon cancer Father 20  . Esophageal cancer Neg Hx   . Rectal cancer Neg Hx   . Stomach cancer Neg Hx     Social History   Socioeconomic History  . Marital status: Married    Spouse name: Not on file  . Number of children: 4  . Years of education: Not on file  . Highest education level: Not on file  Occupational History  . Not on file  Tobacco Use  . Smoking status: Never Smoker  . Smokeless tobacco: Never Used  Vaping Use  . Vaping Use: Never used  Substance and Sexual Activity  . Alcohol use: No  . Drug use: No  . Sexual activity: Yes    Birth control/protection: Condom  Other Topics Concern  . Not on file  Social History Narrative  . Not on file   Social Determinants of Health   Financial Resource Strain: Not on file  Food Insecurity: Not on file  Transportation Needs: Not on file  Physical Activity: Not on file  Stress: Not on file  Social Connections: Not on file  Intimate Partner Violence: Not on file    Outpatient Medications Prior to Visit  Medication Sig Dispense Refill  . FLUoxetine (PROZAC) 20 MG capsule Take 20 mg by mouth every morning.    Marland Kitchen lisinopril-hydrochlorothiazide (ZESTORETIC) 20-25 MG tablet Take 1 tablet by mouth every morning.    . sildenafil  (VIAGRA) 100 MG tablet Take 100 mg by mouth as needed. (Patient not taking: Reported on 07/29/2020)    . olmesartan-hydrochlorothiazide (BENICAR HCT) 40-25 MG tablet Take 1 tablet by mouth daily. 30 tablet 0   No facility-administered medications prior to visit.    No Known Allergies  ROS Review of Systems  All other systems reviewed and are negative.     Objective:    Physical Exam Vitals reviewed.  HENT:     Head: Normocephalic.     Right Ear: External ear normal.     Left Ear: External ear normal.     Nose: Nose normal.  Cardiovascular:     Rate and Rhythm: Normal rate and regular rhythm.  Pulmonary:     Effort: Pulmonary effort is normal.     Breath sounds: Normal breath sounds.  Abdominal:     General: Bowel sounds are normal.     Palpations: Abdomen is soft.  Musculoskeletal:        General: Normal range of motion.     Cervical back: Normal range of motion and neck supple.  Skin:    General: Skin is warm.  Neurological:     Mental Status: He is alert and oriented to person, place, and time.  Psychiatric:        Mood and Affect: Mood normal.        Behavior: Behavior normal.        Thought Content: Thought content normal.        Judgment: Judgment normal.     BP (!) 141/95 (BP Location: Right Arm, Patient Position: Sitting, Cuff Size: Large)   Pulse 72   Temp (!) 97.5 F (36.4 C) (Temporal)   Ht 6\' 2"  (1.88 m)   Wt 233 lb 9.6 oz (106 kg)   SpO2 95%   BMI 29.99 kg/m  Wt Readings from Last 3 Encounters:  07/29/20 233 lb 9.6 oz (106 kg)  04/06/20 246 lb (111.6 kg)  06/05/19 242 lb 3.2 oz (109.9 kg)     Health Maintenance Due  Topic Date Due  . COLONOSCOPY  Never done    There are no preventive care reminders to display for this patient.  Lab Results  Component Value Date   TSH 1.230 12/06/2016   Lab Results  Component Value Date   WBC 7.2 06/09/2019   HGB 14.0 06/09/2019   HCT 44.9 06/09/2019   MCV 80 06/09/2019   PLT 822 (HH)  06/09/2019   Lab Results  Component Value Date   NA 139 06/09/2019   K 4.6 06/09/2019   CO2 24 06/09/2019   GLUCOSE 89 06/09/2019   BUN 18 06/09/2019   CREATININE 1.51 (H) 06/09/2019   BILITOT 0.4 06/09/2019   ALKPHOS 62 06/09/2019   AST 34 06/09/2019   ALT 46 (H) 06/09/2019   PROT 6.4 06/09/2019   ALBUMIN 4.3 06/09/2019   CALCIUM 9.5 06/09/2019   ANIONGAP 6 03/13/2016   Lab Results  Component Value Date   CHOL 189 07/29/2020   Lab Results  Component Value Date   HDL 52 07/29/2020   Lab Results  Component Value Date   LDLCALC 123 (H) 07/29/2020   Lab Results  Component Value Date   TRIG 74 07/29/2020   Lab Results  Component Value Date   CHOLHDL 3.6 07/29/2020   Lab Results  Component Value Date   HGBA1C 5.6 06/05/2019      Assessment & Plan:  Michah was seen today for medication review .  Diagnoses and all orders for this visit:  Essential hypertension Counseled on blood pressure goal of less than 130/80, low-sodium, DASH diet, medication compliance, 150 minutes of moderate intensity exercise per week. Discussed medication compliance, adverse effects. -     lisinopril-hydrochlorothiazide (ZESTORETIC) 20-25 MG tablet; Take 1 tablet by mouth every morning. -     amLODipine (NORVASC) 10 MG tablet; Take 1 tablet (10 mg total) by mouth daily.  Screening for hyperlipidemia -     Lipid Panel  Prostate cancer screening -     PSA    Meds ordered this encounter  Medications  . lisinopril-hydrochlorothiazide (ZESTORETIC) 20-25 MG tablet    Sig: Take 1 tablet by mouth every morning.    Dispense:  90 tablet    Refill:  1  . amLODipine (NORVASC) 10 MG tablet    Sig: Take 1 tablet (10 mg total) by mouth daily.    Dispense:  90 tablet    Refill:  3    Follow-up: Return in about 7 weeks (around 09/16/2020) for Bp check.    Kerin Perna, NP

## 2020-07-30 ENCOUNTER — Other Ambulatory Visit (INDEPENDENT_AMBULATORY_CARE_PROVIDER_SITE_OTHER): Payer: Self-pay | Admitting: Primary Care

## 2020-07-30 LAB — LIPID PANEL
Chol/HDL Ratio: 3.6 ratio (ref 0.0–5.0)
Cholesterol, Total: 189 mg/dL (ref 100–199)
HDL: 52 mg/dL (ref >39)
LDL Chol Calc (NIH): 123 mg/dL — ABNORMAL HIGH (ref 0–99)
Triglycerides: 74 mg/dL (ref 0–149)
VLDL Cholesterol Cal: 14 mg/dL (ref 5–40)

## 2020-07-30 LAB — PSA: Prostate Specific Ag, Serum: 0.4 ng/mL (ref 0.0–4.0)

## 2020-07-30 MED ORDER — ATORVASTATIN CALCIUM 10 MG PO TABS: 10.0000 mg | ORAL_TABLET | Freq: Every day | ORAL | 3 refills | Status: DC

## 2020-08-05 ENCOUNTER — Telehealth (INDEPENDENT_AMBULATORY_CARE_PROVIDER_SITE_OTHER): Payer: Self-pay

## 2020-08-05 NOTE — Telephone Encounter (Signed)
Patient is aware that his cholesterol is elevated. Continue Atorvastatin 10mg  at bedtime continue monitor decrease your fatty foods, red meat, cheese, milk and increase fiber like whole grains and veggies. He verbalized understanding. Nat Christen, CMA

## 2020-08-05 NOTE — Telephone Encounter (Signed)
-----   Message from Kerin Perna, NP sent at 08/04/2020 10:26 PM EST ----- Elevated cholesterol continue Atorvastatin 10mg  at bedtime continue monitor decrease your fatty foods, red meat, cheese, milk and increase fiber like whole grains and veggies.

## 2020-08-16 ENCOUNTER — Other Ambulatory Visit (INDEPENDENT_AMBULATORY_CARE_PROVIDER_SITE_OTHER): Payer: Self-pay | Admitting: Primary Care

## 2020-08-16 DIAGNOSIS — I10 Essential (primary) hypertension: Secondary | ICD-10-CM

## 2020-08-16 NOTE — Telephone Encounter (Signed)
Patient is due labs for BP medication- has appointment scheduled. RF per protocol

## 2020-08-16 NOTE — Telephone Encounter (Signed)
Requested medication (s) are due for refill today - yes  Requested medication (s) are on the active medication list -yes  Future visit scheduled -yes  Last refill: 07/15/20  Notes to clinic: Request RF- historical provider  Requested Prescriptions  Pending Prescriptions Disp Refills   FLUoxetine (PROZAC) 20 MG capsule [Pharmacy Med Name: FLUOXETINE 20MG  CAPSULES] 30 capsule     Sig: TAKE 1 CAPSULE BY MOUTH EVERY MORNING      Psychiatry:  Antidepressants - SSRI Passed - 08/16/2020  2:47 PM      Passed - Valid encounter within last 6 months    Recent Outpatient Visits           2 weeks ago Essential hypertension   [facility] 10/14/2020, NP   1 year ago Encounter to establish care   [facility], NP   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   [facility] at [employer], [employer], MD   2 years ago Essential hypertension   Primary Care at [facility], MD   2 years ago Abnormal urine odor   [facility], Otho Bellows, PA-C       Future Appointments             In 1 month Marolyn Hammock, Randa Evens, NP [facility]              Signed Prescriptions Disp Refills   lisinopril-hydrochlorothiazide (ZESTORETIC) 20-25 MG tablet 30 tablet 0    Sig: TAKE 1 TABLET BY MOUTH EVERY MORNING      Cardiovascular:  ACEI + Diuretic Combos Failed - 08/16/2020  2:47 PM      Failed - Na in normal range and within 180 days    Sodium  Date Value Ref Range Status  06/09/2019 139 134 - 144 mmol/L Final          Failed - K in normal range and within 180 days    Potassium  Date Value Ref Range Status  06/09/2019 4.6 3.5 - 5.2 mmol/L Final          Failed - Cr in normal range and within 180 days    Creatinine, Ser  Date Value Ref Range Status  06/09/2019 1.51 (H) 0.76 - 1.27 mg/dL Final          Failed - Ca in normal range and within 180 days     Calcium  Date Value Ref Range Status  06/09/2019 9.5 8.7 - 10.2 mg/dL Final          Failed - Last BP in normal range    BP Readings from Last 1 Encounters:  07/29/20 (!) 141/95          Passed - Patient is not pregnant      Passed - Valid encounter within last 6 months    Recent Outpatient Visits           2 weeks ago Essential hypertension   [facility], NP   1 year ago Encounter to establish care   [facility], NP   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   [facility] at [facility], Etta Grandchild, MD   2 years ago Essential hypertension   [facility] at [facility], Cipriano Mile, MD   2 years ago Abnormal urine  odor   [facility], Marolyn Hammock, PA-C       Future Appointments             In 1 month Randa Evens, Kinnie Scales, NP [facility]                 Requested Prescriptions  Pending Prescriptions Disp Refills   FLUoxetine (PROZAC) 20 MG capsule [Pharmacy Med Name: FLUOXETINE 20MG  CAPSULES] 30 capsule     Sig: TAKE 1 CAPSULE BY MOUTH EVERY MORNING      Psychiatry:  Antidepressants - SSRI Passed - 08/16/2020  2:47 PM      Passed - Valid encounter within last 6 months    Recent Outpatient Visits           2 weeks ago Essential hypertension   [facility] 10/14/2020, NP   1 year ago Encounter to establish care   [facility], NP   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   Primary [facility] at [facility], [employer], MD   2 years ago Essential hypertension   Primary Care at [facility], [facility], MD   2 years ago Abnormal urine odor   [facility], Otho Bellows, PA-C       Future Appointments             In 1 month Marolyn Hammock, Randa Evens, NP [facility]              Signed Prescriptions  Disp Refills   lisinopril-hydrochlorothiazide (ZESTORETIC) 20-25 MG tablet 30 tablet 0    Sig: TAKE 1 TABLET BY MOUTH EVERY MORNING      Cardiovascular:  ACEI + Diuretic Combos Failed - 08/16/2020  2:47 PM      Failed - Na in normal range and within 180 days    Sodium  Date Value Ref Range Status  06/09/2019 139 134 - 144 mmol/L Final          Failed - K in normal range and within 180 days    Potassium  Date Value Ref Range Status  06/09/2019 4.6 3.5 - 5.2 mmol/L Final          Failed - Cr in normal range and within 180 days    Creatinine, Ser  Date Value Ref Range Status  06/09/2019 1.51 (H) 0.76 - 1.27 mg/dL Final          Failed - Ca in normal range and within 180 days    Calcium  Date Value Ref Range Status  06/09/2019 9.5 8.7 - 10.2 mg/dL Final          Failed - Last BP in normal range    BP Readings from Last 1 Encounters:  07/29/20 (!) 141/95          Passed - Patient is not pregnant      Passed - Valid encounter within last 6 months    Recent Outpatient Visits           2 weeks ago Essential hypertension   [facility], NP   1 year ago Encounter to establish care   [facility], NP   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

## 2020-08-19 ENCOUNTER — Other Ambulatory Visit (INDEPENDENT_AMBULATORY_CARE_PROVIDER_SITE_OTHER): Payer: Self-pay | Admitting: Primary Care

## 2020-08-19 MED ORDER — FLUOXETINE HCL 20 MG PO CAPS: 20.0000 mg | ORAL_CAPSULE | Freq: Every morning | ORAL | 1 refills | Status: DC

## 2020-08-19 NOTE — Telephone Encounter (Signed)
Sent to PCP to refill if appropriate.  

## 2020-09-15 ENCOUNTER — Other Ambulatory Visit (INDEPENDENT_AMBULATORY_CARE_PROVIDER_SITE_OTHER): Payer: Self-pay | Admitting: Primary Care

## 2020-09-15 DIAGNOSIS — I10 Essential (primary) hypertension: Secondary | ICD-10-CM

## 2020-09-16 ENCOUNTER — Ambulatory Visit (INDEPENDENT_AMBULATORY_CARE_PROVIDER_SITE_OTHER): Payer: 59 | Admitting: Primary Care

## 2020-09-22 ENCOUNTER — Other Ambulatory Visit: Payer: Self-pay

## 2020-09-22 ENCOUNTER — Ambulatory Visit (INDEPENDENT_AMBULATORY_CARE_PROVIDER_SITE_OTHER): Payer: 59 | Admitting: Primary Care

## 2020-09-22 ENCOUNTER — Encounter (INDEPENDENT_AMBULATORY_CARE_PROVIDER_SITE_OTHER): Payer: Self-pay | Admitting: Primary Care

## 2020-09-22 VITALS — BP 143/79 | HR 86 | Temp 97.3°F | Resp 16 | Wt 237.0 lb

## 2020-09-22 DIAGNOSIS — Z760 Encounter for issue of repeat prescription: Secondary | ICD-10-CM

## 2020-09-22 DIAGNOSIS — E785 Hyperlipidemia, unspecified: Secondary | ICD-10-CM

## 2020-09-22 DIAGNOSIS — I10 Essential (primary) hypertension: Secondary | ICD-10-CM

## 2020-09-22 DIAGNOSIS — F411 Generalized anxiety disorder: Secondary | ICD-10-CM | POA: Diagnosis not present

## 2020-09-22 MED ORDER — AMLODIPINE BESYLATE 10 MG PO TABS: 10.0000 mg | ORAL_TABLET | Freq: Every day | ORAL | 1 refills | Status: DC

## 2020-09-22 NOTE — Patient Instructions (Signed)

## 2020-09-22 NOTE — Progress Notes (Signed)
Established Patient Office Visit  Subjective:  Patient ID: Stephen Patrick, male    DOB: [birth year]  Age: 55 y.o. MRN: [id number]  CC:  Chief Complaint  Patient presents with  . Hypertension    HPI Stephen Patrick presents for hypertension management - Denies shortness of breath, headaches, chest pain or lower extremity edema. Needing medication refills.   Past Medical History:  Diagnosis Date  . Hyperlipidemia    on meds  . Hypertension    on meds  . Thrombocytosis     Past Surgical History:  Procedure Laterality Date  . WISDOM TOOTH EXTRACTION  2005    Family History  Problem Relation Age of Onset  . Colon polyps Mother 48  . Colon polyps Father   . Colon cancer Father 55  . Esophageal cancer Neg Hx   . Rectal cancer Neg Hx   . Stomach cancer Neg Hx     Social History   Socioeconomic History  . Marital status: Married    Spouse name: Not on file  . Number of children: 4  . Years of education: Not on file  . Highest education level: Not on file  Occupational History  . Not on file  Tobacco Use  . Smoking status: Never Smoker  . Smokeless tobacco: Never Used  Vaping Use  . Vaping Use: Never used  Substance and Sexual Activity  . Alcohol use: No  . Drug use: No  . Sexual activity: Yes    Birth control/protection: Condom  Other Topics Concern  . Not on file  Social History Narrative  . Not on file   Social Determinants of Health   Financial Resource Strain: Not on file  Food Insecurity: Not on file  Transportation Needs: Not on file  Physical Activity: Not on file  Stress: Not on file  Social Connections: Not on file  Intimate Partner Violence: Not on file    Outpatient Medications Prior to Visit  Medication Sig Dispense Refill  . atorvastatin (LIPITOR) 10 MG tablet Take 1 tablet (10 mg total) by mouth daily. 90 tablet 3  . FLUoxetine (PROZAC) 20 MG capsule Take 1 capsule (20 mg total) by mouth every morning. 90 capsule 1  .  lisinopril-hydrochlorothiazide (ZESTORETIC) 20-25 MG tablet TAKE 1 TABLET BY MOUTH EVERY MORNING 15 tablet 0  . sildenafil (VIAGRA) 100 MG tablet Take 100 mg by mouth as needed. (Patient not taking: Reported on 07/29/2020)    . amLODipine (NORVASC) 10 MG tablet Take 1 tablet (10 mg total) by mouth daily. 90 tablet 3   No facility-administered medications prior to visit.    No Known Allergies  ROS Review of Systems  All other systems reviewed and are negative.     Objective:    Physical Exam Vitals reviewed.  Constitutional:      Appearance: He is obese.  HENT:     Head: Normocephalic.     Right Ear: Tympanic membrane and external ear normal.     Left Ear: Tympanic membrane and external ear normal.     Nose: Nose normal.  Eyes:     Extraocular Movements: Extraocular movements intact.     Pupils: Pupils are equal, round, and reactive to light.  Cardiovascular:     Rate and Rhythm: Normal rate and regular rhythm.  Pulmonary:     Effort: Pulmonary effort is normal.     Breath sounds: Normal breath sounds.  Abdominal:     General: Bowel sounds are normal.  Palpations: Abdomen is soft.  Musculoskeletal:        General: Normal range of motion.     Cervical back: Normal range of motion and neck supple.  Skin:    General: Skin is warm and dry.  Neurological:     Mental Status: He is alert and oriented to person, place, and time.  Psychiatric:        Mood and Affect: Mood normal.        Behavior: Behavior normal.        Thought Content: Thought content normal.        Judgment: Judgment normal.     BP (!) 143/79   Pulse 86   Temp (!) 97.3 F (36.3 C)   Resp 16   Wt 237 lb (107.5 kg)   SpO2 96%   BMI 30.43 kg/m  Wt Readings from Last 3 Encounters:  09/22/20 237 lb (107.5 kg)  07/29/20 233 lb 9.6 oz (106 kg)  04/06/20 246 lb (111.6 kg)     Health Maintenance Due  Topic Date Due  . COLONOSCOPY (Pts 45-62yrs Insurance coverage will need to be confirmed)   Never done    There are no preventive care reminders to display for this patient.  Lab Results  Component Value Date   TSH 1.230 12/06/2016   Lab Results  Component Value Date   WBC 7.2 06/09/2019   HGB 14.0 06/09/2019   HCT 44.9 06/09/2019   MCV 80 06/09/2019   PLT 822 (HH) 06/09/2019   Lab Results  Component Value Date   NA 139 06/09/2019   K 4.6 06/09/2019   CO2 24 06/09/2019   GLUCOSE 89 06/09/2019   BUN 18 06/09/2019   CREATININE 1.51 (H) 06/09/2019   BILITOT 0.4 06/09/2019   ALKPHOS 62 06/09/2019   AST 34 06/09/2019   ALT 46 (H) 06/09/2019   PROT 6.4 06/09/2019   ALBUMIN 4.3 06/09/2019   CALCIUM 9.5 06/09/2019   ANIONGAP 6 03/13/2016   Lab Results  Component Value Date   CHOL 189 07/29/2020   Lab Results  Component Value Date   HDL 52 07/29/2020   Lab Results  Component Value Date   LDLCALC 123 (H) 07/29/2020   Lab Results  Component Value Date   TRIG 74 07/29/2020   Lab Results  Component Value Date   CHOLHDL 3.6 07/29/2020   Lab Results  Component Value Date   HGBA1C 5.6 06/05/2019      Assessment & Plan:  Stephen Patrick was seen today for hypertension.  Diagnoses and all orders for this visit:  GAD (generalized anxiety disorder) Prozac is helping he can tell his anxiety and mood elevation has level off and help with stress  Essential hypertension Counseled on blood pressure goal of less than 130/80, low-sodium, DASH diet, medication compliance, 150 minutes of moderate intensity exercise per week. Discussed medication compliance, adverse effects. Stated he had ham and 2 hot all the way  -     amLODipine (NORVASC) 10 MG tablet; Take 1 tablet (10 mg total) by mouth daily.  Medication refill amLODipine (NORVASC) 10 MG tablet; Take 1 tablet (10 mg total) by mouth daily.  Hyperlipidemia, unspecified hyperlipidemia type On atorvastatin 10mg  decrease your fatty foods, red meat, cheese, milk and increase fiber like whole grains and veggies.   Lipids future  Follow-up: No follow-ups on file.    Kerin Perna, NP

## 2020-09-22 NOTE — Progress Notes (Signed)
F/u HTN 

## 2020-09-23 ENCOUNTER — Other Ambulatory Visit (INDEPENDENT_AMBULATORY_CARE_PROVIDER_SITE_OTHER): Payer: Self-pay | Admitting: Primary Care

## 2020-09-23 ENCOUNTER — Other Ambulatory Visit (INDEPENDENT_AMBULATORY_CARE_PROVIDER_SITE_OTHER): Payer: 59

## 2020-09-23 DIAGNOSIS — I10 Essential (primary) hypertension: Secondary | ICD-10-CM

## 2020-09-23 DIAGNOSIS — E785 Hyperlipidemia, unspecified: Secondary | ICD-10-CM

## 2020-09-24 ENCOUNTER — Other Ambulatory Visit (INDEPENDENT_AMBULATORY_CARE_PROVIDER_SITE_OTHER): Payer: 59

## 2020-09-24 LAB — CBC WITH DIFFERENTIAL/PLATELET
Basophils Absolute: 0.1 10*3/uL (ref 0.0–0.2)
Basos: 1 %
EOS (ABSOLUTE): 0.1 10*3/uL (ref 0.0–0.4)
Eos: 2 %
Hematocrit: 43 % (ref 37.5–51.0)
Hemoglobin: 13.5 g/dL (ref 13.0–17.7)
Immature Grans (Abs): 0 10*3/uL (ref 0.0–0.1)
Immature Granulocytes: 0 %
Lymphocytes Absolute: 1.4 10*3/uL (ref 0.7–3.1)
Lymphs: 24 %
MCH: 25.4 pg — ABNORMAL LOW (ref 26.6–33.0)
MCHC: 31.4 g/dL — ABNORMAL LOW (ref 31.5–35.7)
MCV: 81 fL (ref 79–97)
Monocytes Absolute: 0.7 10*3/uL (ref 0.1–0.9)
Monocytes: 11 %
Neutrophils Absolute: 3.7 10*3/uL (ref 1.4–7.0)
Neutrophils: 62 %
Platelets: 961 10*3/uL (ref 150–450)
RBC: 5.31 x10E6/uL (ref 4.14–5.80)
RDW: 15.3 % (ref 11.6–15.4)
WBC: 6 10*3/uL (ref 3.4–10.8)

## 2020-09-24 LAB — CMP14+EGFR
ALT: 24 IU/L (ref 0–44)
AST: 24 IU/L (ref 0–40)
Albumin/Globulin Ratio: 2.1 (ref 1.2–2.2)
Albumin: 4.7 g/dL (ref 3.8–4.9)
Alkaline Phosphatase: 68 IU/L (ref 44–121)
BUN/Creatinine Ratio: 15 (ref 9–20)
BUN: 16 mg/dL (ref 6–24)
Bilirubin Total: 0.6 mg/dL (ref 0.0–1.2)
CO2: 23 mmol/L (ref 20–29)
Calcium: 9.5 mg/dL (ref 8.7–10.2)
Chloride: 99 mmol/L (ref 96–106)
Creatinine, Ser: 1.08 mg/dL (ref 0.76–1.27)
GFR calc Af Amer: 88 mL/min/{1.73_m2} (ref >59)
GFR calc non Af Amer: 76 mL/min/{1.73_m2} (ref >59)
Globulin, Total: 2.2 g/dL (ref 1.5–4.5)
Glucose: 89 mg/dL (ref 65–99)
Potassium: 4.9 mmol/L (ref 3.5–5.2)
Sodium: 136 mmol/L (ref 134–144)
Total Protein: 6.9 g/dL (ref 6.0–8.5)

## 2020-09-24 LAB — LIPID PANEL
Chol/HDL Ratio: 2.5 ratio (ref 0.0–5.0)
Cholesterol, Total: 130 mg/dL (ref 100–199)
HDL: 53 mg/dL (ref >39)
LDL Chol Calc (NIH): 65 mg/dL (ref 0–99)
Triglycerides: 57 mg/dL (ref 0–149)
VLDL Cholesterol Cal: 12 mg/dL (ref 5–40)

## 2020-09-27 ENCOUNTER — Ambulatory Visit (INDEPENDENT_AMBULATORY_CARE_PROVIDER_SITE_OTHER): Payer: 59 | Admitting: Primary Care

## 2020-09-28 ENCOUNTER — Ambulatory Visit (INDEPENDENT_AMBULATORY_CARE_PROVIDER_SITE_OTHER): Payer: 59 | Admitting: Primary Care

## 2020-09-30 ENCOUNTER — Telehealth: Payer: Self-pay | Admitting: Hematology and Oncology

## 2020-09-30 ENCOUNTER — Other Ambulatory Visit (INDEPENDENT_AMBULATORY_CARE_PROVIDER_SITE_OTHER): Payer: Self-pay | Admitting: Primary Care

## 2020-09-30 DIAGNOSIS — D75839 Thrombocytosis, unspecified: Secondary | ICD-10-CM

## 2020-09-30 NOTE — Telephone Encounter (Signed)
Received a new hem referral from [place] for thrombocytosis. Pt has been cld and scheduled to see Dr. Lindi Adie on 3/14 at 2:15pm. Pt aware to arrive 20 minutes early.

## 2020-10-04 ENCOUNTER — Other Ambulatory Visit (INDEPENDENT_AMBULATORY_CARE_PROVIDER_SITE_OTHER): Payer: Self-pay | Admitting: Primary Care

## 2020-10-04 DIAGNOSIS — I10 Essential (primary) hypertension: Secondary | ICD-10-CM

## 2020-10-04 NOTE — Telephone Encounter (Signed)
Sent to PCP ?

## 2020-10-24 NOTE — Progress Notes (Incomplete)
[facility] CONSULT NOTE  Patient Care Team: Kerin Perna, NP as PCP - General (Internal Medicine)  CHIEF COMPLAINTS/PURPOSE OF CONSULTATION:  Newly diagnosed thrombocytosis  HISTORY OF PRESENTING ILLNESS:  Stephen Patrick 55 y.o. male is here because of recent diagnosis of thrombocytosis. He is referred by [facility]. Labs on 09/23/20 showed Hg 13.5, HCT 43.0, platelets 961. He presents to the clinic today for initial evaluation.   I reviewed his records extensively and collaborated the history with the patient.  MEDICAL HISTORY:  Past Medical History:  Diagnosis Date  . Hyperlipidemia    on meds  . Hypertension    on meds  . Thrombocytosis     SURGICAL HISTORY: Past Surgical History:  Procedure Laterality Date  . WISDOM TOOTH EXTRACTION  2005    SOCIAL HISTORY: Social History   Socioeconomic History  . Marital status: Married    Spouse name: Not on file  . Number of children: 4  . Years of education: Not on file  . Highest education level: Not on file  Occupational History  . Not on file  Tobacco Use  . Smoking status: Never Smoker  . Smokeless tobacco: Never Used  Vaping Use  . Vaping Use: Never used  Substance and Sexual Activity  . Alcohol use: No  . Drug use: No  . Sexual activity: Yes    Birth control/protection: Condom  Other Topics Concern  . Not on file  Social History Narrative  . Not on file   Social Determinants of Health   Financial Resource Strain: Not on file  Food Insecurity: Not on file  Transportation Needs: Not on file  Physical Activity: Not on file  Stress: Not on file  Social Connections: Not on file  Intimate Partner Violence: Not on file    FAMILY HISTORY: Family History  Problem Relation Age of Onset  . Colon polyps Mother 41  . Colon polyps Father   . Colon cancer Father 6  . Esophageal cancer Neg Hx   . Rectal cancer Neg Hx   . Stomach cancer Neg Hx     ALLERGIES:  has No  Known Allergies.  MEDICATIONS:  Current Outpatient Medications  Medication Sig Dispense Refill  . amLODipine (NORVASC) 10 MG tablet Take 1 tablet (10 mg total) by mouth daily. 90 tablet 1  . atorvastatin (LIPITOR) 10 MG tablet Take 1 tablet (10 mg total) by mouth daily. 90 tablet 3  . FLUoxetine (PROZAC) 20 MG capsule Take 1 capsule (20 mg total) by mouth every morning. 90 capsule 1  . lisinopril-hydrochlorothiazide (ZESTORETIC) 20-25 MG tablet TAKE 1 TABLET BY MOUTH EVERY MORNING 90 tablet 1  . sildenafil (VIAGRA) 100 MG tablet Take 100 mg by mouth as needed. (Patient not taking: Reported on 07/29/2020)     No current facility-administered medications for this visit.    REVIEW OF SYSTEMS:   Constitutional: Denies fevers, chills or abnormal night sweats Eyes: Denies blurriness of vision, double vision or watery eyes Ears, nose, mouth, throat, and face: Denies mucositis or sore throat Respiratory: Denies cough, dyspnea or wheezes Cardiovascular: Denies palpitation, chest discomfort or lower extremity swelling Gastrointestinal:  Denies nausea, heartburn or change in bowel habits Skin: Denies abnormal skin rashes Lymphatics: Denies new lymphadenopathy or easy bruising Neurological:Denies numbness, tingling or new weaknesses Behavioral/Psych: Mood is stable, no new changes  All other systems were reviewed with the patient and are negative.  PHYSICAL EXAMINATION: ECOG PERFORMANCE STATUS: {CHL ONC ECOG MC:[id number]}  There were  no vitals filed for this visit. There were no vitals filed for this visit.  GENERAL:alert, no distress and comfortable SKIN: skin color, texture, turgor are normal, no rashes or significant lesions EYES: normal, conjunctiva are pink and non-injected, sclera clear OROPHARYNX:no exudate, no erythema and lips, buccal mucosa, and tongue normal  NECK: supple, thyroid normal size, non-tender, without nodularity LYMPH:  no palpable lymphadenopathy in the cervical,  axillary or inguinal LUNGS: clear to auscultation and percussion with normal breathing effort HEART: regular rate & rhythm and no murmurs and no lower extremity edema ABDOMEN:abdomen soft, non-tender and normal bowel sounds Musculoskeletal:no cyanosis of digits and no clubbing  PSYCH: alert & oriented x 3 with fluent speech NEURO: no focal motor/sensory deficits  LABORATORY DATA:  I have reviewed the data as listed Lab Results  Component Value Date   WBC 6.0 09/23/2020   HGB 13.5 09/23/2020   HCT 43.0 09/23/2020   MCV 81 09/23/2020   PLT 961 (HH) 09/23/2020   Lab Results  Component Value Date   NA 136 09/23/2020   K 4.9 09/23/2020   CL 99 09/23/2020   CO2 23 09/23/2020    RADIOGRAPHIC STUDIES: I have personally reviewed the radiological reports and agreed with the findings in the report.  ASSESSMENT AND PLAN:  No problem-specific Assessment & Plan notes found for this encounter.   All questions were answered. The patient knows to call the clinic with any problems, questions or concerns.   Rulon Eisenmenger, MD, MPH 10/24/2020    I, Molly Dorshimer, am acting as scribe for Nicholas Lose, MD.  {Add scribe attestation statement}

## 2020-10-25 ENCOUNTER — Inpatient Hospital Stay: Payer: 59 | Attending: Hematology and Oncology | Admitting: Hematology and Oncology

## 2020-10-25 NOTE — Assessment & Plan Note (Deleted)
JAK-2 Mutation Positive Lab Review:  Treatment Considerations: Treatment depends on platelet count level as well as history of thrombosis.  A. For low risk patients, (platelet counts less than 1000 and no history of blood clots) the treatment would be with aspirin therapy B. for high risk patients(platelet counts greater than 1000/history of blood clot) the treatment would be platelet lowering therapy with aspirin   Platelet count:  June 2019:746  09/23/2020: 961  Treatment Plan: Aspirin with platelet lowering therapy: Recommended anagrelide Return to clinic in 3 months with labs and follow-up. RTC in 1 year with labs and follow up.

## 2020-12-30 ENCOUNTER — Ambulatory Visit (INDEPENDENT_AMBULATORY_CARE_PROVIDER_SITE_OTHER): Payer: Self-pay | Admitting: *Deleted

## 2020-12-30 NOTE — Telephone Encounter (Signed)
I returned pt's call.   He is c/o having a cough for 3 weeks now.  He was sick with upper respiratory symptoms last week but is feeling better now.   No fever but still feels bad.  He is coughing up thick yellow/green mucus.   The cough bothers him worse at night   Denies shortness of breath.   His covid test on Monday was negative.  Did have fever but not any longer.  He vomited a couple of time when he had a coughing spell last week.   He also mentioned the toes on both feet next to his big toes the toenails are discolored.   The toes also feel numb.   He has been using an OTC medication for toenail fungus which seems to be helping.  There are no appts available with Juluis Mire, NP at [facility] within the 24 hour timeframe indicated on the protocol.    I have referred him to the urgent care.   He was agreeable to going either today or tomorrow.     I sent my notes to [facility].   Reason for Disposition . [1] Continuous (nonstop) coughing interferes with work or school AND [2] no improvement using cough treatment per Care Advice  Answer Assessment - Initial Assessment Questions 1. ONSET: "When did the cough begin?"      3 weeks ago. 2. SEVERITY: "How bad is the cough today?"      Keeps me up at night 3. SPUTUM: "Describe the color of your sputum" (none, dry cough; clear, white, yellow, green)     Dark yellow occasion green 4. HEMOPTYSIS: "Are you coughing up any blood?" If so ask: "How much?" (flecks, streaks, tablespoons, etc.)     No 5. DIFFICULTY BREATHING: "Are you having difficulty breathing?" If Yes, ask: "How bad is it?" (e.g., mild, moderate, severe)    - MILD: No SOB at rest, mild SOB with walking, speaks normally in sentences, can lie down, no retractions, pulse < 100.    - MODERATE: SOB at rest, SOB with minimal exertion and prefers to sit, cannot lie down flat, speaks in phrases, mild retractions, audible wheezing, pulse  100-120.    - SEVERE: Very SOB at rest, speaks in single words, struggling to breathe, sitting hunched forward, retractions, pulse > 120      No 6. FEVER: "Do you have a fever?" If Yes, ask: "What is your temperature, how was it measured, and when did it start?"     No not now  7. CARDIAC HISTORY: "Do you have any history of heart disease?" (e.g., heart attack, congestive heart failure)      No 8. LUNG HISTORY: "Do you have any history of lung disease?"  (e.g., pulmonary embolus, asthma, emphysema)     No  Wheezing  Did a covid test Monday and it was negative.  Had both covid shots and a booster. 9. PE RISK FACTORS: "Do you have a history of blood clots?" (or: recent major surgery, recent prolonged travel, bedridden)     No 10. OTHER SYMPTOMS: "Do you have any other symptoms?" (e.g., runny nose, wheezing, chest pain)       Vomited a couple times only.   I have numbness in toe next to big toe on both feet.  Toenail is discolored.  I got some fungus stuff that is helping.     11. PREGNANCY: "Is there any chance you are pregnant?" "When was your last  menstrual period?"       N/A 12. TRAVEL: "Have you traveled out of the country in the last month?" (e.g., travel history, exposures)       No  Protocols used: [place]

## 2020-12-30 NOTE — Telephone Encounter (Signed)
FYI

## 2020-12-31 ENCOUNTER — Other Ambulatory Visit: Payer: Self-pay

## 2020-12-31 ENCOUNTER — Encounter (HOSPITAL_COMMUNITY): Payer: Self-pay | Admitting: *Deleted

## 2020-12-31 ENCOUNTER — Ambulatory Visit (HOSPITAL_COMMUNITY)
Admission: EM | Admit: 2020-12-31 | Discharge: 2020-12-31 | Disposition: A | Discharge status: Home / Self Care | Payer: 59 | Attending: Family Medicine | Admitting: Family Medicine

## 2020-12-31 DIAGNOSIS — J3089 Other allergic rhinitis: Secondary | ICD-10-CM | POA: Diagnosis not present

## 2020-12-31 DIAGNOSIS — R059 Cough, unspecified: Secondary | ICD-10-CM

## 2020-12-31 MED ORDER — CETIRIZINE HCL 10 MG PO TABS: 10.0000 mg | ORAL_TABLET | Freq: Every day | ORAL | 3 refills | Status: DC

## 2020-12-31 MED ORDER — FLUTICASONE PROPIONATE 50 MCG/ACT NA SUSP: 1.0000 | Freq: Two times a day (BID) | NASAL | 3 refills | Status: DC

## 2020-12-31 MED ORDER — PREDNISONE 20 MG PO TABS: 40.0000 mg | ORAL_TABLET | Freq: Every day | ORAL | 0 refills | Status: DC

## 2020-12-31 NOTE — ED Triage Notes (Signed)
Pt has had a cough for several weeks . Cough is productive with yellow mucous.

## 2020-12-31 NOTE — ED Provider Notes (Signed)
[facility]    CSN: [id number] Arrival date & time: 12/31/20  1750      History   Chief Complaint Chief Complaint  Patient presents with  . Cough    HPI Stephen Patrick is a 55 y.o. male.   Patient presenting today for 2 to 3-week history of cough productive of sputum, nasal congestion, scratchy throat.  He denies chest pain, shortness of breath, headaches, body aches, chills, abdominal pain, nausea vomiting diarrhea.  States his symptoms seem to wax and wane.  Taking cold medication with good temporary relief.  No known history of seasonal allergies, asthma.  No new sick contacts.     Past Medical History:  Diagnosis Date  . Hyperlipidemia    on meds  . Hypertension    on meds  . Thrombocytosis     Patient Active Problem List   Diagnosis Date Noted  . Essential hypertension 12/06/2016  . 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 WISDOM TOOTH EXTRACTION  2005       Home Medications    Prior to Admission medications   Medication Sig Start Date End Date Taking? Authorizing Provider  cetirizine (ZYRTEC ALLERGY) 10 MG tablet Take 1 tablet (10 mg total) by mouth daily. 12/31/20  Yes Volney American, PA-C  fluticasone [facility]) 50 MCG/ACT nasal spray Place 1 spray into both nostrils in the morning and at bedtime. 12/31/20  Yes Volney American, PA-C  predniSONE (DELTASONE) 20 MG tablet Take 2 tablets (40 mg total) by mouth daily with breakfast. 12/31/20  Yes Volney American, PA-C  amLODipine (NORVASC) 10 MG tablet Take 1 tablet (10 mg total) by mouth daily. 09/22/20   Kerin Perna, NP  atorvastatin (LIPITOR) 10 MG tablet Take 1 tablet (10 mg total) by mouth daily. 07/30/20   Kerin Perna, NP  FLUoxetine (PROZAC) 20 MG capsule Take 1 capsule (20 mg total) by mouth every morning. 08/19/20   Kerin Perna, NP   lisinopril-hydrochlorothiazide (ZESTORETIC) 20-25 MG tablet TAKE 1 TABLET BY MOUTH EVERY MORNING 10/05/20   Kerin Perna, NP  sildenafil (VIAGRA) 100 MG tablet Take 100 mg by mouth as needed. Patient not taking: Reported on 07/29/2020 12/23/19   [provider]    Family History Family History  Problem Relation Age of Onset  . Colon polyps Mother 64  . Colon polyps Father   . Colon cancer Father 50  . Esophageal cancer Neg Hx   . Rectal cancer Neg Hx   . Stomach cancer Neg Hx     Social History Social History   Tobacco Use  . Smoking status: Never Smoker  . Smokeless tobacco: Never Used  Vaping Use  . Vaping Use: Never used  Substance Use Topics  . Alcohol use: No  . Drug use: No     Allergies   Patient has no known allergies.   Review of Systems Review of Systems Per HPI  Physical Exam Triage Vital Signs ED Triage Vitals  Enc Vitals Group     BP 12/31/20 1809 (!) 166/107     Pulse Rate 12/31/20 1809 74     Resp 12/31/20 1809 18     Temp 12/31/20 1809 98.3 F (36.8 C)     Temp src --      SpO2 12/31/20 1809 95 %     Weight --      Height --  Head Circumference --      Peak Flow --      Pain Score 12/31/20 1807 8     Pain Loc --      Pain Edu? --      Excl. in [place]? --    No data found.  Updated Vital Signs BP (!) 166/107   Pulse 74   Temp 98.3 F (36.8 C)   Resp 18   SpO2 95%   Visual Acuity Right Eye Distance:   Left Eye Distance:   Bilateral Distance:    Right Eye Near:   Left Eye Near:    Bilateral Near:     Physical Exam Vitals and nursing note reviewed.  Constitutional:      Appearance: Normal appearance.  HENT:     Head: Atraumatic.     Right Ear: Tympanic membrane normal.     Left Ear: Tympanic membrane normal.     Nose: Rhinorrhea present.     Mouth/Throat:     Mouth: Mucous membranes are moist.     Pharynx: Posterior oropharyngeal erythema present. No oropharyngeal exudate.  Eyes:     Extraocular  Movements: Extraocular movements intact.     Conjunctiva/sclera: Conjunctivae normal.  Cardiovascular:     Rate and Rhythm: Normal rate and regular rhythm.  Pulmonary:     Effort: Pulmonary effort is normal. No respiratory distress.     Breath sounds: Normal breath sounds. No wheezing or rales.  Abdominal:     General: Bowel sounds are normal. There is no distension.     Palpations: Abdomen is soft.     Tenderness: There is no abdominal tenderness. There is no guarding.  Musculoskeletal:        General: Normal range of motion.     Cervical back: Normal range of motion and neck supple.  Skin:    General: Skin is warm and dry.  Neurological:     General: No focal deficit present.     Mental Status: He is oriented to person, place, and time.  Psychiatric:        Mood and Affect: Mood normal.        Thought Content: Thought content normal.        Judgment: Judgment normal.    UC Treatments / Results  Labs (all labs ordered are listed, but only abnormal results are displayed) Labs Reviewed - No data to display  EKG  Radiology No results found.  Procedures Procedures (including critical care time)  Medications Ordered in UC Medications - No data to display  Initial Impression / Assessment and Plan / UC Course  I have reviewed the triage vital signs and the nursing notes.  Pertinent labs & imaging results that were available during my care of the patient were reviewed by me and considered in my medical decision making (see chart for details).     Given timeframe and characteristics of illness, strongly suspect new onset seasonal allergies causing his symptoms.  We will treat with prednisone burst, start Zyrtec and Flonase regimen.  Discussed other supportive medications and home care.  Follow-up with PCP for recheck in the next few weeks.  Final Clinical Impressions(s) / UC Diagnoses   Final diagnoses:  Seasonal allergic rhinitis due to other allergic trigger  Cough    Discharge Instructions   None    ED Prescriptions    Medication Sig Dispense Auth. Provider   predniSONE (DELTASONE) 20 MG tablet Take 2 tablets (40 mg total) by mouth daily with breakfast.  10 tablet Volney American, [place]   cetirizine (ZYRTEC ALLERGY) 10 MG tablet Take 1 tablet (10 mg total) by mouth daily. 30 tablet Volney American, PA-C   fluticasone [facility]) 50 MCG/ACT nasal spray Place 1 spray into both nostrils in the morning and at bedtime. 16 g Volney American, [place]     PDMP not reviewed this encounter.   Volney American, [place] 12/31/20 [phone]

## 2021-01-14 ENCOUNTER — Ambulatory Visit (INDEPENDENT_AMBULATORY_CARE_PROVIDER_SITE_OTHER): Payer: Self-pay | Admitting: *Deleted

## 2021-01-14 NOTE — Telephone Encounter (Signed)
FYI

## 2021-01-14 NOTE — Telephone Encounter (Signed)
Patient has pain in foot- discolored toe with pus. Advised patient no appointment in office and mobile unit not available today- advised UC for evaluation of foot.  Reason for Disposition . [1] Looks infected (spreading redness, red streak, pus) AND [2] fever  Answer Assessment - Initial Assessment Questions 1. LOCATION: "Which toe?"      Second toe- R foot 2. APPEARANCE: "What does it look like?"      Discolored- brown and has pus 3. ONSET: "When did it start?"      1 month 4. PAIN: "Is there any pain?" If Yes, ask: "How bad is the pain?"   (Scale 1-10; or mild, moderate, severe)     Yes-8 5. REDNESS: "Is there any redness of the skin?" If Yes, ask: "How much of the toe is red?"     Yes- discolored- half of toe 6. OTHER SYMPTOMS: "Do you have any other symptoms?" (e.g., fever, shaking, chills, red streak up foot)    Yes- fever 7. PREGNANCY: "Is there any chance you are pregnant?" "When was your last menstrual period?"     n/a  Protocols used: TOENAIL - INGROWN-A-AH

## 2021-04-28 ENCOUNTER — Ambulatory Visit (INDEPENDENT_AMBULATORY_CARE_PROVIDER_SITE_OTHER): Payer: 59 | Admitting: Primary Care

## 2021-10-10 ENCOUNTER — Emergency Department (HOSPITAL_COMMUNITY)
Admission: EM | Admit: 2021-10-10 | Discharge: 2021-10-10 | Discharge status: Against Medical Advice | Payer: No Typology Code available for payment source | Attending: Emergency Medicine | Admitting: Emergency Medicine

## 2021-10-10 ENCOUNTER — Encounter (HOSPITAL_COMMUNITY): Payer: Self-pay

## 2021-10-10 ENCOUNTER — Encounter: Payer: Self-pay | Admitting: Emergency Medicine

## 2021-10-10 ENCOUNTER — Ambulatory Visit
Admission: EM | Admit: 2021-10-10 | Discharge: 2021-10-10 | Disposition: A | Discharge status: Home / Self Care | Payer: 59 | Attending: Physician Assistant | Admitting: Physician Assistant

## 2021-10-10 ENCOUNTER — Emergency Department (HOSPITAL_COMMUNITY): Payer: No Typology Code available for payment source

## 2021-10-10 ENCOUNTER — Other Ambulatory Visit: Payer: Self-pay

## 2021-10-10 DIAGNOSIS — I10 Essential (primary) hypertension: Secondary | ICD-10-CM | POA: Insufficient documentation

## 2021-10-10 DIAGNOSIS — Z20822 Contact with and (suspected) exposure to covid-19: Secondary | ICD-10-CM | POA: Insufficient documentation

## 2021-10-10 DIAGNOSIS — Z7951 Long term (current) use of inhaled steroids: Secondary | ICD-10-CM | POA: Insufficient documentation

## 2021-10-10 DIAGNOSIS — Z5321 Procedure and treatment not carried out due to patient leaving prior to being seen by health care provider: Secondary | ICD-10-CM | POA: Diagnosis not present

## 2021-10-10 DIAGNOSIS — B029 Zoster without complications: Secondary | ICD-10-CM

## 2021-10-10 DIAGNOSIS — R9431 Abnormal electrocardiogram [ECG] [EKG]: Secondary | ICD-10-CM | POA: Diagnosis present

## 2021-10-10 DIAGNOSIS — Z7952 Long term (current) use of systemic steroids: Secondary | ICD-10-CM | POA: Insufficient documentation

## 2021-10-10 DIAGNOSIS — Z5329 Procedure and treatment not carried out because of patient's decision for other reasons: Secondary | ICD-10-CM

## 2021-10-10 DIAGNOSIS — R079 Chest pain, unspecified: Secondary | ICD-10-CM

## 2021-10-10 LAB — CBC
HCT: 46.5 % (ref 39.0–52.0)
Hemoglobin: 14.8 g/dL (ref 13.0–17.0)
MCH: 26.4 pg (ref 26.0–34.0)
MCHC: 31.8 g/dL (ref 30.0–36.0)
MCV: 82.9 fL (ref 80.0–100.0)
Platelets: 680 10*3/uL — ABNORMAL HIGH (ref 150–400)
RBC: 5.61 MIL/uL (ref 4.22–5.81)
RDW: 14.3 % (ref 11.5–15.5)
WBC: 5.2 10*3/uL (ref 4.0–10.5)
nRBC: 0 % (ref 0.0–0.2)

## 2021-10-10 LAB — TROPONIN I (HIGH SENSITIVITY): Troponin I (High Sensitivity): 7 ng/L (ref <18)

## 2021-10-10 LAB — COMPREHENSIVE METABOLIC PANEL
ALT: 21 U/L (ref 0–44)
AST: 24 U/L (ref 15–41)
Albumin: 3.5 g/dL (ref 3.5–5.0)
Alkaline Phosphatase: 59 U/L (ref 38–126)
Anion gap: 7 (ref 5–15)
BUN: 10 mg/dL (ref 6–20)
CO2: 27 mmol/L (ref 22–32)
Calcium: 8.7 mg/dL — ABNORMAL LOW (ref 8.9–10.3)
Chloride: 102 mmol/L (ref 98–111)
Creatinine, Ser: 1.37 mg/dL — ABNORMAL HIGH (ref 0.61–1.24)
GFR, Estimated: >60 mL/min (ref >60)
Glucose, Bld: 90 mg/dL (ref 70–99)
Potassium: 4.4 mmol/L (ref 3.5–5.1)
Sodium: 136 mmol/L (ref 135–145)
Total Bilirubin: 0.8 mg/dL (ref 0.3–1.2)
Total Protein: 5.8 g/dL — ABNORMAL LOW (ref 6.5–8.1)

## 2021-10-10 LAB — RESP PANEL BY RT-PCR (FLU A&B, COVID) ARPGX2
Influenza A by PCR: NEGATIVE
Influenza B by PCR: NEGATIVE
SARS Coronavirus 2 by RT PCR: NEGATIVE

## 2021-10-10 MED ORDER — ACYCLOVIR 400 MG PO TABS: 800.0000 mg | ORAL_TABLET | Freq: Every day | ORAL | 0 refills | Status: DC

## 2021-10-10 MED ORDER — SODIUM CHLORIDE 0.9 % IV BOLUS: 1000.0000 mL | Freq: Once | INTRAVENOUS | Status: DC

## 2021-10-10 MED ORDER — ASPIRIN 81 MG PO CHEW
324.0000 mg | CHEWABLE_TABLET | Freq: Once | ORAL | Status: AC
  Administered 2021-10-10: 324 mg via ORAL

## 2021-10-10 MED ORDER — AMLODIPINE BESYLATE 5 MG PO TABS
10.0000 mg | ORAL_TABLET | Freq: Once | ORAL | Status: DC
  Filled 2021-10-10: qty 2

## 2021-10-10 NOTE — ED Notes (Addendum)
This RN went in to medicate pt and start fluids on pt. PT is requesting to leave, has taken off all monitoring eqip - MD to be notified

## 2021-10-10 NOTE — Discharge Instructions (Addendum)
Advise further evaluation in the Emergency Department

## 2021-10-10 NOTE — ED Provider Notes (Addendum)
[facility]    CSN: [id number] Arrival date & time: 10/10/21  1745      History   Chief Complaint Chief Complaint  Patient presents with   Herpes Zoster    HPI Stephen Patrick is a 56 y.o. male.   Pt complains of rash to left mid back that started a few days ago. Reports rash is burning and painful. He has tried nothing for his sx.  He also complains of intermittent chest pain that started about one month ago.  He reports today he has had chest pain on and off.  Reports it has been happening at rest, but lasting most of the day.  He has not taken his HTN medications for the last few days.  Denies radiation of chest pain, shortness of breath, diaphoresis, fatigue.     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 Authorizing Provider  acyclovir (ZOVIRAX) 400 MG tablet Take 2 tablets (800 mg total) by mouth 5 (five) times daily for 7 days. 10/10/21 10/17/21 Yes Ward, Lenise Arena, PA-C  amLODipine (NORVASC) 10 MG tablet Take 1 tablet (10 mg total) by mouth daily. 09/22/20   Kerin Perna, NP  atorvastatin (LIPITOR) 10 MG tablet Take 1 tablet (10 mg total) by mouth daily. 07/30/20   Kerin Perna, NP  cetirizine (ZYRTEC ALLERGY) 10 MG tablet Take 1 tablet (10 mg total) by mouth daily. 12/31/20   Volney American, PA-C  FLUoxetine (PROZAC) 20 MG capsule Take 1 capsule (20 mg total) by mouth every morning. 08/19/20   Kerin Perna, NP  fluticasone (FLONASE) 50 MCG/ACT nasal spray Place 1 spray into both nostrils in the morning and  at bedtime. 12/31/20   Volney American, PA-C  lisinopril-hydrochlorothiazide (ZESTORETIC) 20-25 MG tablet TAKE 1 TABLET BY MOUTH EVERY MORNING 10/05/20   Kerin Perna, NP  predniSONE (DELTASONE) 20 MG tablet Take 2 tablets (40 mg total) by mouth daily with breakfast. 12/31/20   Volney American, PA-C  sildenafil (VIAGRA) 100 MG tablet Take 100 mg by mouth as needed. Patient not taking: Reported on 07/29/2020 12/23/19   [provider]    Family History Family History  Problem Relation Age of Onset   Colon polyps Mother 70   Colon polyps Father    Colon cancer Father 22   Esophageal cancer Neg Hx    Rectal cancer Neg Hx    Stomach cancer Neg Hx     Social History Social History   Tobacco Use   Smoking status: Never   Smokeless tobacco: Never  Vaping Use   Vaping Use: Never used  Substance Use Topics   Alcohol use: No   Drug use: No     Allergies   Patient has no known allergies.   Review of Systems Review of Systems  Constitutional:  Negative for chills and fever.  HENT:  Negative for ear pain and sore throat.   Eyes:  Negative for pain and visual disturbance.  Respiratory:  Negative for cough and shortness of breath.   Cardiovascular:  Positive for chest pain. Negative for palpitations.  Gastrointestinal:  Negative for abdominal pain and vomiting.  Genitourinary:  Negative for dysuria and hematuria.  Musculoskeletal:  Negative for arthralgias and back pain.  Skin:  Positive for rash. Negative for color change.  Neurological:  Negative for seizures and syncope.  All other systems reviewed and are negative.   Physical Exam Triage Vital Signs ED Triage Vitals [10/10/21 1832]  Enc Vitals Group     BP (!) 161/92     Pulse Rate 75     Resp 16     Temp 98.2 F (36.8 C)     Temp Source Oral     SpO2 98 %     Weight      Height      Head Circumference      Peak Flow      Pain Score      Pain Loc      Pain Edu?      Excl. in [place]?     No data found.  Updated Vital Signs BP (!) 161/92 (BP Location: Left Arm)    Pulse 75    Temp 98.2 F (36.8 C) (Oral)    Resp 16    SpO2 98%   Visual Acuity Right Eye Distance:   Left Eye Distance:   Bilateral Distance:    Right Eye Near:   Left Eye Near:    Bilateral Near:     Physical Exam Vitals and nursing note reviewed.  Constitutional:      General: He is not in acute distress.    Appearance: He is well-developed.  HENT:     Head: Normocephalic and atraumatic.  Eyes:     Conjunctiva/sclera: Conjunctivae normal.  Cardiovascular:     Rate and Rhythm: Normal rate and regular rhythm.     Heart sounds: No murmur heard. Pulmonary:     Effort: Pulmonary effort is normal. No respiratory distress.     Breath sounds: Normal breath sounds.  Abdominal:     Palpations: Abdomen is soft.     Tenderness: There is no abdominal tenderness.  Musculoskeletal:        General: No swelling.     Cervical back: Neck supple.  Skin:    General: Skin is warm and dry.     Capillary Refill: Capillary refill takes less than 2 seconds.       Neurological:     Mental Status: He is alert.  Psychiatric:        Mood and Affect: Mood normal.     UC Treatments / Results  Labs (all labs ordered are listed, but only abnormal results are displayed) Labs Reviewed - No data to display  EKG   Radiology No results found.  Procedures Procedures (including critical care time)  Medications Ordered in UC Medications  aspirin chewable tablet 324 mg (324 mg Oral Given 10/10/21 1912)    Initial Impression / Assessment and Plan / UC Course  I have reviewed the triage vital signs and the nursing notes.  Pertinent labs & imaging results that were available during my care of the patient were reviewed by me and considered in my medical decision making (see chart for details).     Intermittent chest pain for the last few weeks, worse today.  Abnormal EKG in UC, no EKG to compare to. Pt  transported via EMS to ED for further evaluation due to  chest pain, EKG changes.  Shingles-acyclovir prescribed.  Final Clinical Impressions(s) / UC Diagnoses   Final diagnoses:  Herpes zoster without complication  Chest pain, unspecified type     Discharge Instructions      Advise further evaluation in the Emergency Department    ED Prescriptions     Medication Sig Dispense Auth. Provider   acyclovir (ZOVIRAX) 400 MG tablet Take 2 tablets (800 mg total) by mouth 5 (five) times daily for 7 days. 70 tablet Ward, Lenise Arena, PA-C      PDMP not reviewed this encounter.   Ward, Lenise Arena, PA-C 10/10/21 1917    Ward, Lenise Arena, PA-C 10/11/21 [phone]

## 2021-10-10 NOTE — ED Notes (Signed)
MD aware pt leaving [facility]

## 2021-10-10 NOTE — ED Notes (Signed)
Patient is being discharged from the Urgent Care and sent to the Emergency Department via EMS . Per [facility] PA, patient is in need of higher level of care due to rule out stemi. Patient is aware and verbalizes understanding of plan of care.  Vitals:   10/10/21 1832  BP: (!) 161/92  Pulse: 75  Resp: 16  Temp: 98.2 F (36.8 C)  SpO2: 98%

## 2021-10-10 NOTE — ED Triage Notes (Signed)
Pt BIB EMS from UC. Pt was seen for a rash and dx with shingles. Pt also said he was having intermittent CP for the last month. UC EKG showed a stemi - they gave ASA and fluids.  Pt is currently having no CP. EMS shot two EKGs which were normal. Pt has hx of HTN - forgot to take meds today BP 180/101 HR 80s 100%RA  20LAC

## 2021-10-10 NOTE — ED Notes (Addendum)
Patient's initial complaint on arrival to urgent care was a rash. On triage was shown to have shingles. Provider ordered EKG. On completion observed STEMI reading, gave to [facility] PA, who informed the patient he needed emergent medical care. The patient and wife originally refused EMS services, causing a delay in care. Went and discussed options with patient, informed patient about what a STEMI is and the potential consequences of that. They decided they would go via EMS. Notified emergency services. Gave 324 chewable aspirin, started 20G IV LAC with NS, placed pads on patient. EMS arrived on scene at 7:30, left with patient at 7:32.

## 2021-10-10 NOTE — ED Triage Notes (Signed)
Vesicular rash, not crossing midline, left back, wrapping around to left chest along a dermatone path. Did not receive shingles vaccine.

## 2021-10-10 NOTE — ED Provider Notes (Signed)
[name] EMERGENCY DEPARTMENT Provider Note   CSN: [id number] Arrival date & time: 10/10/21  2000     History  Chief Complaint  Patient presents with   Abnormal ECG    Stephen Patrick is a 56 y.o. male.  This is a 56 y.o. male with significant medical history as below, including hyperlipidemia, hypertension who presents to the ED with complaint of rash, abnormal EKG.  Patient was having a rash to his left torso/back for the past 3 to 4 days.  Has never had shingles before.  No history of shingles infection previously.  He has been having some mild chest pressure in the past few days intermittently.  No chest pain or pressure at time of assessment.  He was sent from urgent care secondary to abnormal EKG.   He has some mild discomfort at the site of his likely zoster rash but otherwise has no acute complaints.  Did not take his blood pressure medication this morning      Past Medical History: No date: Hyperlipidemia     Comment:  on meds No date: Hypertension     Comment:  on meds No date: Thrombocytosis  Past Surgical History: 2005: WISDOM TOOTH EXTRACTION    The history is provided by the patient. No language interpreter was used.      Home Medications Prior to Admission medications   Medication Sig Start Date End Date Taking? Authorizing Provider  acyclovir (ZOVIRAX) 400 MG tablet Take 2 tablets (800 mg total) by mouth 5 (five) times daily for 7 days. 10/10/21 10/17/21  Ward, Lenise Arena, PA-C  amLODipine (NORVASC) 10 MG tablet Take 1 tablet (10 mg total) by mouth daily. 09/22/20   Kerin Perna, NP  atorvastatin (LIPITOR) 10 MG tablet Take 1 tablet (10 mg total) by mouth daily. 07/30/20   Kerin Perna, NP  cetirizine (ZYRTEC ALLERGY) 10 MG tablet Take 1 tablet (10 mg total) by mouth daily. 12/31/20   Volney American, PA-C  FLUoxetine (PROZAC) 20 MG capsule Take 1 capsule (20 mg total) by mouth every morning. 08/19/20   Kerin Perna, NP  fluticasone (FLONASE) 50 MCG/ACT nasal spray Place 1 spray into both nostrils in the morning and at bedtime. 12/31/20   Volney American, PA-C  lisinopril-hydrochlorothiazide (ZESTORETIC) 20-25 MG tablet TAKE 1 TABLET BY MOUTH EVERY MORNING 10/05/20   Kerin Perna, NP  predniSONE (DELTASONE) 20 MG tablet Take 2 tablets (40 mg total) by mouth daily with breakfast. 12/31/20   Volney American, PA-C  sildenafil (VIAGRA) 100 MG tablet Take 100 mg by mouth as needed. Patient not taking: Reported on 07/29/2020 12/23/19   [provider]      Allergies    Patient has no known allergies.    Review of Systems   Review of Systems  Constitutional:  Negative for chills and fever.  HENT:  Negative for facial swelling and trouble swallowing.   Eyes:  Negative for photophobia and visual disturbance.  Respiratory:  Positive for chest tightness. Negative for cough and shortness of breath.   Cardiovascular:  Negative for palpitations and leg swelling.  Gastrointestinal:  Negative for abdominal pain, nausea and vomiting.  Endocrine: Negative for polydipsia and polyuria.  Genitourinary:  Negative for difficulty urinating and hematuria.  Musculoskeletal:  Negative for gait problem and joint swelling.  Skin:  Positive for rash. Negative for pallor.  Neurological:  Negative for syncope and headaches.  Psychiatric/Behavioral:  Negative for agitation and confusion.  Physical Exam Updated Vital Signs BP (!) 152/82    Pulse (!) 56    Temp 98.9 F (37.2 C) (Oral)    Resp 15    Ht 6\' 2"  (1.88 m)    Wt 105.7 kg    SpO2 100%    BMI 29.92 kg/m  Physical Exam Vitals and nursing note reviewed.  Constitutional:      General: He is not in acute distress.    Appearance: Normal appearance. He is well-developed. He is not ill-appearing, toxic-appearing or diaphoretic.  HENT:     Head: Normocephalic and atraumatic.     Right Ear: External ear normal.     Left Ear: External ear normal.      Mouth/Throat:     Mouth: Mucous membranes are moist.  Eyes:     General: No scleral icterus. Cardiovascular:     Rate and Rhythm: Normal rate and regular rhythm.     Pulses: Normal pulses.     Heart sounds: Normal heart sounds.  Pulmonary:     Effort: Pulmonary effort is normal. No respiratory distress.     Breath sounds: Normal breath sounds.  Chest:    Abdominal:     General: Abdomen is flat.     Palpations: Abdomen is soft.     Tenderness: There is no abdominal tenderness.  Musculoskeletal:        General: Normal range of motion.       Arms:     Cervical back: Normal range of motion.     Right lower leg: No edema.     Left lower leg: No edema.  Skin:    General: Skin is warm and dry.     Capillary Refill: Capillary refill takes less than 2 seconds.  Neurological:     Mental Status: He is alert and oriented to person, place, and time.  Psychiatric:        Mood and Affect: Mood normal.        Behavior: Behavior normal.    ED Results / Procedures / Treatments   Labs (all labs ordered are listed, but only abnormal results are displayed) Labs Reviewed  COMPREHENSIVE METABOLIC PANEL - Abnormal; Notable for the following components:      Result Value   Creatinine, Ser 1.37 (*)    Calcium 8.7 (*)    Total Protein 5.8 (*)    All other components within normal limits  CBC - Abnormal; Notable for the following components:   Platelets 680 (*)    All other components within normal limits  RESP PANEL BY RT-PCR (FLU A&B, COVID) [id number]  TROPONIN I (HIGH SENSITIVITY)  TROPONIN I (HIGH SENSITIVITY)    EKG None  Radiology DG Chest Port 1 View  Result Date: 10/10/2021 CLINICAL DATA:  Abnormal EKG. EXAM: PORTABLE CHEST 1 VIEW COMPARISON:  Chest radiograph dated 03/13/2016 and CT dated 03/13/2016 FINDINGS: No focal consolidation, pleural effusion, or pneumothorax. The cardiac silhouette is within normal limits. No acute osseous pathology. IMPRESSION: No active disease.  Electronically Signed   By: Anner Crete M.D.   On: 10/10/2021 21:16    Procedures Procedures    Medications Ordered in ED Medications  amLODipine (NORVASC) tablet 10 mg (has no administration in time range)  sodium chloride 0.9 % bolus 1,000 mL (has no administration in time range)    ED Course/ Medical Decision Making/ A&P  Medical Decision Making Amount and/or Complexity of Data Reviewed Independent Historian: spouse Labs: ordered. Radiology: ordered. Decision-making details documented in ED Course. ECG/medicine tests: ordered and independent interpretation performed. Decision-making details documented in ED Course.  Risk Prescription drug management. Decision regarding hospitalization.    CC: Abnormal EKG, rash  This patient presents to the Emergency Department for the above complaint. This involves an extensive number of treatment options and is a complaint that carries with it a high risk of complications and morbidity. Vital signs were reviewed. Serious etiologies considered.  Record review:  Previous records obtained and reviewed   Medical and surgical history as noted above.   Work up as above, notable for:   Labs & imaging results that were available during my care of the patient were visualized by me and considered in my medical decision making.   I ordered imaging studies which included chest x-ray and I visualized the imaging and I agree with radiologist interpretation. No acute process in the chest   Cardiac monitoring reviewed and interpreted personally which shows normal sinus rhythm.  EKG without STEMI.  There is some likely early repolarization.  There is diffuse T wave inversion will obtain troponins, cardiac work-up.     No old EKG to compare.  He has no chest pain or dyspnea at this time  Error in confirming EKG in muse. There is NO STEMI.     Social determinants of health include - N/a  Management: He received  aspirin prior to arrival to the emergency department.  Will give home blood pressure medications as he forgot to take them earlier   Reassessment:    Initial troponin was negative.  Patient does not want to stay for delta troponin.  EKG without evidence of ischemia.  Chest x-ray negative.  He has low risk heart score  Patient will leave AMA.  Advised patient to follow-up with primary care doctor.  Advised patient return if the would like to complete assessment or symptoms worsen/change.  He did not receive AVS.  The patient has requested to leave the ED against medical advice. I believe this patient is of sound mind and medical decision making capacity to refuse medical care. The patient is responding and asking questions appropriately. The patient is oriented to person, place and time. The patient is not psychotic, delusional, suicidal, homicidal or hallucinating. The patient demonstrates a normal mental capacity to make decisions regarding their healthcare. The patient is clinically sober and does not appear to be under the influence of any illicit drugs at this time. The patient has been advised of the risks, in layman terms, of leaving AMA which include, but are not limited to death, coma, permanent disability, loss of current lifestyle, delay in diagnosis. Alternatives have been offered - the patient remains steadfast in their wish to leave. The patient has been advised that should they change their mind they are welcome to return to this hospital, or any other, at any time. The patient understands that in no way does an [facility] discharge mean that I do not want them to have the best medical care available. To this end, I have offered appropriate prescriptions, referrals, and discharge instructions. The patient did NOT sign AMA paperwork. The above discussion was witnessed by another member of staff.     Counseled patient for approximately 3 minutes regarding smoking cessation. Discussed risks of  smoking and how they applied and affected their visit here today. Patient not ready to quit at this time, however will  follow up with their primary doctor when they are.   CPT code: [phone]: intermediate counseling for smoking cessation     This chart was dictated using voice recognition software.  Despite best efforts to proofread,  errors can occur which can change the documentation meaning.         Final Clinical Impression(s) / ED Diagnoses Final diagnoses:  Abnormal EKG  Left against medical advice    Rx / DC Orders ED Discharge Orders     None         Jeanell Sparrow, DO 10/10/21 2230

## 2021-10-11 ENCOUNTER — Telehealth: Payer: Self-pay | Admitting: Emergency Medicine

## 2021-10-11 MED ORDER — ACYCLOVIR 400 MG PO TABS: 800.0000 mg | ORAL_TABLET | Freq: Every day | ORAL | 0 refills | Status: AC

## 2021-10-11 MED ORDER — ACYCLOVIR 400 MG PO TABS: 800.0000 mg | ORAL_TABLET | Freq: Every day | ORAL | 0 refills | Status: DC

## 2021-10-11 MED ORDER — ACYCLOVIR 400 MG PO TABS: 400.0000 mg | ORAL_TABLET | Freq: Four times a day (QID) | ORAL | 0 refills | Status: DC

## 2021-12-19 ENCOUNTER — Ambulatory Visit (INDEPENDENT_AMBULATORY_CARE_PROVIDER_SITE_OTHER): Payer: Self-pay

## 2021-12-19 NOTE — Telephone Encounter (Signed)
?  Chief Complaint: rectal bleeding ?Symptoms: rectal bleeding ?Frequency: several weeks, today and 1 week ago  ?Pertinent Negatives: Patient denies abdominal pain, rectal pain, constipation or diarrhea ?Disposition: '[]'$ ED /'[]'$ Urgent Care (no appt availability in office) / '[]'$ Appointment(In office/virtual)/ '[]'$  [facility]/ '[]'$ Home Care/ '[]'$ Refused Recommended Disposition /'[]'$ [facility]/ '[]'$  Follow-up with PCP ?Additional Notes: attempted to schedule OV for pt but no openings with provider until 01/12/22. Cari, PA at [facility] wasn't showing openings either so advised pt I will send to provider and have someone f/up to schedule an appt. Care advice given and pt verbalized understanding.  ? ?Summary: advice  ? Caller was not with the patient at the time, called in to report the pt has previously had blood in his stool. Caller stated to contact the patient directly at [phone].   ?  ? ?Reason for Disposition ? [1] Rectal bleeding is minimal (e.g., blood just on toilet paper, few drops, streaks on surface of normal formed BM) AND [2] bleeding recurs 3 or more times on treatment ? ?Answer Assessment - Initial Assessment Questions ?1. APPEARANCE of BLOOD: "What color is it?" "Is it passed separately, on the surface of the stool, or mixed in with the stool?"  ?    Mixed in stool  ?2. AMOUNT: "How much blood was passed?"  ?    light ?3. FREQUENCY: "How many times has blood been passed with the stools?"  ?    2 in last 2 weeks  ?4. ONSET: "When was the blood first seen in the stools?" (Days or weeks)  ?    Several  ?5. DIARRHEA: "Is there also some diarrhea?" If Yes, ask: "How many diarrhea stools in the past 24 hours?"  ?    No ?6. CONSTIPATION: "Do you have constipation?" If Yes, ask: "How bad is it?" ?    No ?8. BLOOD THINNERS: "Do you take any blood thinners?" (e.g., Coumadin/warfarin, Pradaxa/dabigatran, aspirin) ?    no ?9. OTHER SYMPTOMS: "Do you have any other symptoms?"  (e.g., abdomen pain, vomiting,  dizziness, fever) ?    no ? ?Protocols used: Rectal Bleeding-A-AH ? ?

## 2021-12-20 NOTE — Telephone Encounter (Signed)
Attempted to reach patient at number provided  three times to schedule appointment number rings busy.  ?

## 2022-02-06 ENCOUNTER — Ambulatory Visit (INDEPENDENT_AMBULATORY_CARE_PROVIDER_SITE_OTHER): Payer: Self-pay | Admitting: *Deleted

## 2022-02-22 ENCOUNTER — Encounter (INDEPENDENT_AMBULATORY_CARE_PROVIDER_SITE_OTHER): Payer: Self-pay | Admitting: Primary Care

## 2022-02-22 ENCOUNTER — Ambulatory Visit (INDEPENDENT_AMBULATORY_CARE_PROVIDER_SITE_OTHER): Payer: No Typology Code available for payment source | Admitting: Primary Care

## 2022-02-22 VITALS — BP 133/77 | HR 69 | Temp 98.1°F | Ht 74.0 in | Wt 236.0 lb

## 2022-02-22 DIAGNOSIS — Z760 Encounter for issue of repeat prescription: Secondary | ICD-10-CM | POA: Diagnosis not present

## 2022-02-22 DIAGNOSIS — Z1211 Encounter for screening for malignant neoplasm of colon: Secondary | ICD-10-CM

## 2022-02-22 DIAGNOSIS — I10 Essential (primary) hypertension: Secondary | ICD-10-CM | POA: Diagnosis not present

## 2022-02-22 DIAGNOSIS — F411 Generalized anxiety disorder: Secondary | ICD-10-CM | POA: Diagnosis not present

## 2022-02-22 DIAGNOSIS — J301 Allergic rhinitis due to pollen: Secondary | ICD-10-CM

## 2022-02-22 MED ORDER — LISINOPRIL-HYDROCHLOROTHIAZIDE 20-25 MG PO TABS: 1.0000 | ORAL_TABLET | Freq: Every morning | ORAL | 1 refills | Status: DC

## 2022-02-22 MED ORDER — FLUOXETINE HCL 20 MG PO CAPS: 20.0000 mg | ORAL_CAPSULE | Freq: Every morning | ORAL | 1 refills | Status: DC

## 2022-02-22 MED ORDER — CETIRIZINE HCL 10 MG PO TABS: 10.0000 mg | ORAL_TABLET | Freq: Every day | ORAL | 3 refills | Status: DC

## 2022-02-22 MED ORDER — AMLODIPINE BESYLATE 10 MG PO TABS: 10.0000 mg | ORAL_TABLET | Freq: Every day | ORAL | 1 refills | Status: DC

## 2022-02-22 NOTE — Progress Notes (Signed)
[place]   [name] is a 56 y.o. male presents for hypertension evaluation, Denies shortness of breath, headaches, chest pain or lower extremity edema, sudden onset, vision changes, unilateral weakness, dizziness, paresthesias   Patient denies adherence with medications.out of medications  Dietary habits include: DASH diet  Exercise habits include:yes Family / Social history: NO   Past Medical History:  Diagnosis Date   Hyperlipidemia    on meds   Hypertension    on meds   Thrombocytosis    Past Surgical History:  Procedure Laterality Date   WISDOM TOOTH EXTRACTION  2005   No Known Allergies Current Outpatient Medications on File Prior to Visit  Medication Sig Dispense Refill   atorvastatin (LIPITOR) 10 MG tablet Take 1 tablet (10 mg total) by mouth daily. (Patient not taking: Reported on 02/22/2022) 90 tablet 3   fluticasone (FLONASE) 50 MCG/ACT nasal spray Place 1 spray into both nostrils in the morning and at bedtime. (Patient not taking: Reported on 02/22/2022) 16 g 3   No current facility-administered medications on file prior to visit.   Social History   Socioeconomic History   Marital status: Married    Spouse name: Not on file   Number of children: 4   Years of education: Not on file   Highest education level: Not on file  Occupational History   Not on file  Tobacco Use   Smoking status: Some Days    Types: Cigarettes   Smokeless tobacco: Never  Vaping Use   Vaping Use: Never used  Substance and Sexual Activity   Alcohol use: No   Drug use: No   Sexual activity: Yes    Birth control/protection: Condom  Other Topics Concern   Not on file  Social History Narrative   Not on file   Social Determinants of Health   Financial Resource Strain: Not on file  Food Insecurity: Not on file  Transportation Needs: Not on file  Physical Activity: Not on file  Stress: Not on file  Social Connections: Not on file  Intimate Partner  Violence: Not on file   Family History  Problem Relation Age of Onset   Colon polyps Mother 57   Colon polyps Father    Colon cancer Father 52   Esophageal cancer Neg Hx    Rectal cancer Neg Hx    Stomach cancer Neg Hx      OBJECTIVE:  Vitals:   02/22/22 0915  BP: 133/77  Pulse: 69  Temp: 98.1 F (36.7 C)  TempSrc: Oral  SpO2: 97%  Weight: 236 lb (107 kg)  Height: '6\' 2"'$  (1.88 m)    Physical Exam Vitals reviewed.  Constitutional:      Appearance: Normal appearance. He is obese.  HENT:     Head: Normocephalic.     Right Ear: Tympanic membrane and external ear normal.     Left Ear: Tympanic membrane and external ear normal.     Nose: Nose normal.  Eyes:     Extraocular Movements: Extraocular movements intact.     Pupils: Pupils are equal, round, and reactive to light.  Cardiovascular:     Rate and Rhythm: Normal rate and regular rhythm.  Pulmonary:     Effort: Pulmonary effort is normal.     Breath sounds: Normal breath sounds.  Abdominal:     General: Bowel sounds are normal. There is distension.     Palpations: Abdomen is soft.  Musculoskeletal:        General:  Normal range of motion.     Cervical back: Normal range of motion and neck supple.  Skin:    General: Skin is warm and dry.  Neurological:     Mental Status: He is alert and oriented to person, place, and time.  Psychiatric:        Mood and Affect: Mood normal.        Behavior: Behavior normal.        Thought Content: Thought content normal.        Judgment: Judgment normal.     ROS Comprehensive ROS Pertinent positive and negative noted in HPI   Last 3 Office BP readings: BP Readings from Last 3 Encounters:  02/22/22 133/77  10/10/21 (!) 152/82  10/10/21 (!) 161/92    BMET    Component Value Date/Time   NA 136 10/10/2021 2025   NA 136 09/23/2020 0954   K 4.4 10/10/2021 2025   CL 102 10/10/2021 2025   CO2 27 10/10/2021 2025   GLUCOSE 90 10/10/2021 2025   BUN 10 10/10/2021 2025    BUN 16 09/23/2020 0954   CREATININE 1.37 (H) 10/10/2021 2025   CALCIUM 8.7 (L) 10/10/2021 2025   GFRNONAA >60 10/10/2021 2025   GFRAA 88 09/23/2020 0954    Renal function: CrCl cannot be calculated (Patient's most recent lab result is older than the maximum 21 days allowed.).  Clinical ASCVD: Yes  The 10-year ASCVD risk score (Arnett DK, et al., 2019) is: 18.4%   Values used to calculate the score:     Age: 67 years     Sex: Male     Is Non-Hispanic African American: Yes     Diabetic: No     Tobacco smoker: Yes     Systolic Blood Pressure: 347 mmHg     Is BP treated: Yes     HDL Cholesterol: 53 mg/dL     Total Cholesterol: 130 mg/dL  ASCVD risk factors include- Stephen Patrick   ASSESSMENT & PLAN:  Stephen Patrick was seen today for hypertension and medication refill.  Diagnoses and all orders for this visit:  Colon cancer screening Ambulatory referral to Gastroenterology   Essential hypertension  Explained that having normal blood pressure is the goal and medications are helping to get to goal and maintain normal blood pressure. DIET: Limit salt intake, read nutrition labels to check salt content, limit fried and high fatty foods  Avoid using multisymptom OTC cold preparations that generally contain sudafed which can rise BP. Consult with pharmacist on best cold relief products to use for persons with HTN EXERCISE Discussed incorporating exercise such as walking - 30 minutes most days of the week and can do in 10 minute intervals    -     lisinopril-hydrochlorothiazide (ZESTORETIC) 20-25 MG tablet; Take 1 tablet by mouth every morning. -     amLODipine (NORVASC) 10 MG tablet; Take 1 tablet (10 mg total) by mouth daily.  Medication refill -     lisinopril-hydrochlorothiazide (ZESTORETIC) 20-25 MG tablet; Take 1 tablet by mouth every morning. -     FLUoxetine (PROZAC) 20 MG capsule; Take 1 capsule (20 mg total) by mouth every morning. -     cetirizine (ZYRTEC ALLERGY) 10 MG tablet; Take 1  tablet (10 mg total) by mouth daily. -     amLODipine (NORVASC) 10 MG tablet; Take 1 tablet (10 mg total) by mouth daily.  GAD (generalized anxiety disorder) -     FLUoxetine (PROZAC) 20 MG capsule; Take 1 capsule (20  mg total) by mouth every morning.  Seasonal allergic rhinitis due to pollen -     cetirizine (ZYRTEC ALLERGY) 10 MG tablet; Take 1 tablet (10 mg total) by mouth daily.     Meds ordered this encounter  Medications   lisinopril-hydrochlorothiazide (ZESTORETIC) 20-25 MG tablet    Sig: Take 1 tablet by mouth every morning.    Dispense:  90 tablet    Refill:  1    Order Specific Question:   Supervising Provider    Answer:   Tresa Garter [[id number]]   FLUoxetine (PROZAC) 20 MG capsule    Sig: Take 1 capsule (20 mg total) by mouth every morning.    Dispense:  90 capsule    Refill:  1    Order Specific Question:   Supervising Provider    Answer:   Tresa Garter [[id number]]   cetirizine (ZYRTEC ALLERGY) 10 MG tablet    Sig: Take 1 tablet (10 mg total) by mouth daily.    Dispense:  30 tablet    Refill:  3    Order Specific Question:   Supervising Provider    Answer:   Tresa Garter [[id number]]   amLODipine (NORVASC) 10 MG tablet    Sig: Take 1 tablet (10 mg total) by mouth daily.    Dispense:  90 tablet    Refill:  1    Order Specific Question:   Supervising Provider    Answer:   Tresa Garter [[id number]]   This note has been created with Dragon speech recognition software and smart phrase technology. Any transcriptional errors are unintentional.   Kerin Perna, NP 02/22/2022, 9:30 AM

## 2022-03-02 ENCOUNTER — Ambulatory Visit (INDEPENDENT_AMBULATORY_CARE_PROVIDER_SITE_OTHER): Payer: 59 | Admitting: Primary Care

## 2022-05-25 ENCOUNTER — Ambulatory Visit (INDEPENDENT_AMBULATORY_CARE_PROVIDER_SITE_OTHER): Payer: No Typology Code available for payment source | Admitting: Primary Care

## 2022-06-14 ENCOUNTER — Encounter (INDEPENDENT_AMBULATORY_CARE_PROVIDER_SITE_OTHER): Payer: Self-pay | Admitting: Primary Care

## 2022-06-14 ENCOUNTER — Ambulatory Visit (INDEPENDENT_AMBULATORY_CARE_PROVIDER_SITE_OTHER): Payer: No Typology Code available for payment source | Admitting: Primary Care

## 2022-06-14 VITALS — BP 112/75 | HR 52 | Resp 16 | Wt 230.8 lb

## 2022-06-14 DIAGNOSIS — I10 Essential (primary) hypertension: Secondary | ICD-10-CM

## 2022-06-14 DIAGNOSIS — Z1211 Encounter for screening for malignant neoplasm of colon: Secondary | ICD-10-CM

## 2022-06-14 DIAGNOSIS — M5442 Lumbago with sciatica, left side: Secondary | ICD-10-CM | POA: Diagnosis not present

## 2022-06-14 NOTE — Progress Notes (Signed)
[facility]  Subjective: CC: back pain PCP: Kerin Perna, NP HPI: Patient is a 56 y.o. male presenting to clinic today for back pain. Concerns today include:  1. Back Pain Patient reports that pain began 2 weeks ago.  No h/o back pain.  Pain is a 7/10.  It no radiate.  Position worsens pain.  Nothing improves pain.  Patient has been taking nothing for pain .  Patient denies trauma or injury.  No dysuria, hematuria, fevers, chills, nausea, vomiting, abdominal pain, renal stones. Aggravating factors: certain movements and prolonged walking/standing. Alleviating factors: rest. Progressive LE weakness or saddle anesthesia: none. Extremity sensation changes or weakness: none. Ambulatory without difficulty. Normal bowel/bladder habits: yes; without urinary retention. Normal PO intake without n/v. No associated abdominal pain/n/v. Self treatment: has OTC analgesics, with minimal relief. Patient denies: urinary retention/incontinence, bowel incontinence, weakness, falls, sensation changes or pain anywhere else.  h/o back surgeries.  Patient admits the pain started at your doing yard work, chores around the house, and fixing things.  Since then he is concerned is his left pain actually fingertips are experiencing numbness. L3-S1 . 2. Essential hypertension-blood and blood pressure is unremarkable 112/75 patient is adherent with taking blood pressure medication and will continue BP goal - met < 130/80 Explained that having normal blood pressure is the goal and medications are helping to get to goal and maintain normal blood pressure. DIET: Limit salt intake, read nutrition labels to check salt content, limit fried and high fatty foods  Avoid using multisymptom OTC cold preparations that generally contain sudafed which can rise BP. Consult with pharmacist on best cold relief products to use for persons with HTN EXERCISE Discussed incorporating exercise such as walking - 30 minutes  most days of the week and can do in 10 minute intervals     3.Colon cancer screening Referral to GI  Current Outpatient Medications:    amLODipine (NORVASC) 10 MG tablet, Take 1 tablet (10 mg total) by mouth daily., Disp: 90 tablet, Rfl: 1   atorvastatin (LIPITOR) 10 MG tablet, Take 1 tablet (10 mg total) by mouth daily. (Patient not taking: Reported on 02/22/2022), Disp: 90 tablet, Rfl: 3   cetirizine (ZYRTEC ALLERGY) 10 MG tablet, Take 1 tablet (10 mg total) by mouth daily., Disp: 30 tablet, Rfl: 3   FLUoxetine (PROZAC) 20 MG capsule, Take 1 capsule (20 mg total) by mouth every morning., Disp: 90 capsule, Rfl: 1   fluticasone (FLONASE) 50 MCG/ACT nasal spray, Place 1 spray into both nostrils in the morning and at bedtime. (Patient not taking: Reported on 02/22/2022), Disp: 16 g, Rfl: 3   lisinopril-hydrochlorothiazide (ZESTORETIC) 20-25 MG tablet, Take 1 tablet by mouth every morning., Disp: 90 tablet, Rfl: 1 No Known Allergies  Past Medical History:  Diagnosis Date   Hyperlipidemia    on meds   Hypertension    on meds   Thrombocytosis    Social History   Socioeconomic History   Marital status: Married    Spouse name: Not on file   Number of children: 4   Years of education: Not on file   Highest education level: Not on file  Occupational History   Not on file  Tobacco Use   Smoking status: Some Days    Types: Cigarettes   Smokeless tobacco: Never  Vaping Use   Vaping Use: Never used  Substance and Sexual Activity   Alcohol use: No   Drug use: No   Sexual activity: Yes  Birth control/protection: Condom  Other Topics Concern   Not on file  Social History Narrative   Not on file   Social Determinants of Health   Financial Resource Strain: Not on file  Food Insecurity: Not on file  Transportation Needs: Not on file  Physical Activity: Not on file  Stress: Not on file  Social Connections: Not on file  Intimate Partner Violence: Not on file   Past Surgical  History:  Procedure Laterality Date   WISDOM TOOTH EXTRACTION  2005    ROS: per HPI  Objective: Office vital signs reviewed. BP 112/75   Pulse (!) 52   Resp 16   Wt 230 lb 12.8 oz (104.7 kg)   SpO2 97%   BMI 29.63 kg/m  Physical exam: General: Vital signs reviewed.  Patient is well-developed and well-nourished,obese in no acute distress and cooperative with exam. Head: Normocephalic and atraumatic. Eyes: EOMI, conjunctivae normal, no scleral icterus. Neck: Supple, trachea midline, normal ROM, no JVD, masses, thyromegaly, or carotid bruit present. Cardiovascular: RRR, S1 normal, S2 normal, no murmurs, gallops, or rubs. Pulmonary/Chest: Clear to auscultation bilaterally, no wheezes, rales, or rhonchi. Abdominal: Soft, non-tender, non-distended, BS +, no masses, organomegaly, or guarding present. Musculoskeletal: No joint deformities, erythema, or stiffness, ROM full and nontender. Extremities: No lower extremity edema bilaterally,  pulses symmetric and intact bilaterally. No cyanosis or clubbing. Neurological: A&O x3, Strength is normal Skin: Warm, dry and intact. No rashes or erythema. Psychiatric: Normal mood and affect. speech and behavior is normal. Cognition and memory are normal.        The above assessment and management plan was discussed with the patient. The patient verbalized understanding of and has agreed to the management plan. Patient is aware to call the clinic if symptoms persist or worsen. Patient is aware when to return to the clinic for a follow-up visit. Patient educated on when it is appropriate to go to the emergency department.   This note has been created with Surveyor, quantity. Any transcriptional errors are unintentional.   Kerin Perna, NP 06/14/2022, 10:05 AM

## 2022-06-15 ENCOUNTER — Encounter: Payer: Self-pay | Admitting: Surgery

## 2022-06-15 ENCOUNTER — Telehealth (INDEPENDENT_AMBULATORY_CARE_PROVIDER_SITE_OTHER): Payer: Self-pay | Admitting: Primary Care

## 2022-06-15 ENCOUNTER — Ambulatory Visit: Payer: Self-pay

## 2022-06-15 ENCOUNTER — Ambulatory Visit (INDEPENDENT_AMBULATORY_CARE_PROVIDER_SITE_OTHER): Payer: No Typology Code available for payment source | Admitting: Surgery

## 2022-06-15 VITALS — BP 137/83 | HR 79 | Ht 74.0 in | Wt 230.8 lb

## 2022-06-15 DIAGNOSIS — R55 Syncope and collapse: Secondary | ICD-10-CM | POA: Diagnosis not present

## 2022-06-15 DIAGNOSIS — M47816 Spondylosis without myelopathy or radiculopathy, lumbar region: Secondary | ICD-10-CM

## 2022-06-15 DIAGNOSIS — G5602 Carpal tunnel syndrome, left upper limb: Secondary | ICD-10-CM

## 2022-06-15 MED ORDER — METHOCARBAMOL 500 MG PO TABS: 500.0000 mg | ORAL_TABLET | Freq: Three times a day (TID) | ORAL | 0 refills | Status: DC | PRN

## 2022-06-15 MED ORDER — IBUPROFEN 800 MG PO TABS
800.0000 mg | ORAL_TABLET | Freq: Two times a day (BID) | ORAL | 0 refills | Status: DC | PRN
Start: 2022-06-15 — End: 2022-12-20

## 2022-06-15 NOTE — Progress Notes (Signed)
Office Visit Note   Patient: Stephen Patrick           Date of Birth: 16-Mar-1966           MRN: [id number] Visit Date: 06/15/2022              Requested by: Kerin Perna, NP [address] [place],  [place] [address] PCP: Kerin Perna, NP   Assessment & Plan: Visit Diagnoses:  1. Spondylosis of lumbar region without myelopathy or radiculopathy   2. Syncope, unspecified syncope type   3. Carpal tunnel syndrome, left upper limb     Plan: I sent in prescriptions for ibuprofen 800 mg twice daily with food as needed and Robaxin 500 mg every 8 hours as needed for spasms.  For his low back pain I will schedule formal PT.  For his likely carpal tunnel syndrome I will try to manage this conservatively with a removable wrist splint and this was given today.  Will wear this throughout the day when he is active and also at night when he is sleeping.  When he returns if he continues to be symptomatic he may consider doing NCV/EMG study.  Advised patient his wife was present that I am concerned about the syncopal episode that he described.  They need to discuss this further with primary care provider and see if further work-up and referral to cardiology is indicated.  Stressed the importance of this.  Follow-up me in 3 weeks for recheck.  I sent in prescriptions for Robaxin for spasms and ibuprofen.  Follow-Up Instructions: Return in about 3 weeks (around 07/06/2022) for with Arianis Bowditch recheck low back pain and left hand neuropathy.   Orders:  Orders Placed This Encounter  Procedures   XR Lumbar Spine 2-3 Views   Ambulatory referral to Physical Therapy   Meds ordered this encounter  Medications   methocarbamol (ROBAXIN) 500 MG tablet    Sig: Take 1 tablet (500 mg total) by mouth every 8 (eight) hours as needed for muscle spasms.    Dispense:  30 tablet    Refill:  0   ibuprofen (ADVIL) 800 MG tablet    Sig: Take 1 tablet (800 mg total) by mouth every 12 (twelve) hours as needed  (must take with food.).    Dispense:  50 tablet    Refill:  0      Procedures: No procedures performed   Clinical Data: No additional findings.   Subjective: Chief Complaint  Patient presents with   Lower Back - Pain    HPI 56 year old black male who is new patient clinic comes in today with complaints of left hand numbness and tingling and left-sided low back pain.  He is referred by his primary care provider nurse practitioner Juluis Mire.Patient reports having low back pain for about 2 weeks.  States that this was a result of a fall that he had in his house.  He states that he got up out of bed and went to his kitchen and he started feeling a little disoriented.  States that he "bumped into stuff" and then he blacked out.  Thinks that he woke up about 45 minutes later.  When he got up he did have pain in his low back.  Has never had an episode of blacking out previously.  Denies lower extremity radicular symptoms.  States that left-sided low back pain aggravated when he is bending or twisting.  Left hand numbness and tingling into the thumb index long and  ring finger.  Is been off and on for several weeks.  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  No cervical spinal radicular complaint.  Patient has not taken any oral NSAIDs or Tylenol.  He has not had evaluation for his blackout.   Review of Systems Patient describes having a syncopal episode a couple weeks ago when he fell.  Objective: Vital Signs: BP 137/83   Pulse 79   Ht '6\' 2"'$  (1.88 m)   Wt 230 lb 12.8 oz (104.7 kg)   BMI 29.63 kg/m   Physical Exam Constitutional:      Appearance: Normal appearance.  HENT:     Head: Normocephalic.     Nose: Nose normal.  Eyes:     Extraocular Movements: Extraocular movements intact.  Pulmonary:     Effort: No respiratory distress.  Musculoskeletal:     Comments: Gait is normal.  Cervical spine unremarkable.  Left wrist  he has good range of motion.  Positive Tinel's and Phalen's.  Right wrist unremarkable.  Bilateral elbows good range of motion.  Negative Tinel's over the cubital tunnels.  Positive left-sided lumbar paraspinal tenderness/spasm.  Negative logroll bilateral hips.  Some pain in the low back with left straight leg raise.  No focal motor deficits.  Neurological:     Mental Status: He is alert.  Psychiatric:        Mood and Affect: Mood normal.     Ortho Exam  Specialty Comments:  No specialty comments available.  Imaging: No results found.   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   Past Medical History:  Diagnosis Date   Hyperlipidemia    on meds   Hypertension    on meds   Thrombocytosis     Family History  Problem Relation Age of Onset   Colon polyps Mother 50   Colon polyps Father    Colon cancer Father 42   Esophageal cancer Neg Hx    Rectal cancer Neg Hx    Stomach cancer Neg Hx     Past Surgical History:  Procedure Laterality Date   WISDOM TOOTH EXTRACTION  2005   Social History   Occupational History   Not on file  Tobacco Use   Smoking status: Some Days    Types: Cigarettes   Smokeless tobacco: Never  Vaping Use   Vaping Use: Never used  Substance and Sexual Activity   Alcohol use: No   Drug use: No   Sexual activity: Yes    Birth control/protection: Condom

## 2022-06-15 NOTE — Telephone Encounter (Signed)
Copied from [place] [id number]. Topic: Referral - Request for Referral >> Jun 15, 2022  2:11 PM Ludger Nutting wrote: Has patient seen PCP for this complaint? Yes.   *If NO, is insurance requiring patient see PCP for this issue before PCP can refer them? Referral for which specialty: Cardiology Preferred provider/office:  Reason for referral: Chest pains

## 2022-06-15 NOTE — Telephone Encounter (Signed)
Returned pt call. Pt states he spoke to ortho today during his appt and per pt he was told he will need to call his pcp and request a referral to Cardiology   Will forward to provider

## 2022-06-15 NOTE — Telephone Encounter (Signed)
Medication Refill - Medication: atorvastatin (LIPITOR) 10 MG tablet   Has the patient contacted their pharmacy? No. (Agent: If no, request that the patient contact the pharmacy for the refill. If patient does not wish to contact the pharmacy document the reason why and proceed with request.) (Agent: If yes, when and what did the pharmacy advise?)  Preferred Pharmacy (with phone number or street name):  [id number] [place] [phone] Fax: [phone]    Has the patient been seen for an appointment in the last year OR does the patient have an upcoming appointment? Yes.    Agent: Please be advised that RX refills may take up to 3 business days. We ask that you follow-up with your pharmacy.

## 2022-06-16 ENCOUNTER — Other Ambulatory Visit (INDEPENDENT_AMBULATORY_CARE_PROVIDER_SITE_OTHER): Payer: Self-pay | Admitting: Primary Care

## 2022-06-16 DIAGNOSIS — R079 Chest pain, unspecified: Secondary | ICD-10-CM

## 2022-06-23 ENCOUNTER — Ambulatory Visit: Payer: No Typology Code available for payment source | Admitting: Physical Therapy

## 2022-07-10 ENCOUNTER — Ambulatory Visit: Payer: No Typology Code available for payment source | Admitting: Surgery

## 2022-08-10 ENCOUNTER — Other Ambulatory Visit (INDEPENDENT_AMBULATORY_CARE_PROVIDER_SITE_OTHER): Payer: Self-pay | Admitting: Primary Care

## 2022-08-10 DIAGNOSIS — F411 Generalized anxiety disorder: Secondary | ICD-10-CM

## 2022-08-10 DIAGNOSIS — Z760 Encounter for issue of repeat prescription: Secondary | ICD-10-CM

## 2022-08-10 DIAGNOSIS — I10 Essential (primary) hypertension: Secondary | ICD-10-CM

## 2022-08-10 NOTE — Telephone Encounter (Signed)
Copied from [place] [id number]. Topic: General - Other >> Aug 10, 2022  2:44 PM Everette C wrote: Reason for CRM: Medication Refill - Medication: lisinopril-hydrochlorothiazide (ZESTORETIC) 20-25 MG tablet [[id number]]  amLODipine (NORVASC) 10 MG tablet [[id number]]  FLUoxetine (PROZAC) 20 MG capsule [[id number]]  atorvastatin (LIPITOR) 10 MG tablet [[id number]]  Has the patient contacted their pharmacy? Yes.   (Agent: If no, request that the patient contact the pharmacy for the refill. If patient does not wish to contact the pharmacy document the reason why and proceed with request.) (Agent: If yes, when and what did the pharmacy advise?)  Preferred Pharmacy (with phone number or street name): [id number] [place] [place]: [phone]: [phone]: Open 24 hours   Has the patient been seen for an appointment in the last year OR does the patient have an upcoming appointment? Yes.    Agent: Please be advised that RX refills may take up to 3 business days. We ask that you follow-up with your pharmacy.

## 2022-08-11 MED ORDER — LISINOPRIL-HYDROCHLOROTHIAZIDE 20-25 MG PO TABS: 1.0000 | ORAL_TABLET | Freq: Every morning | ORAL | 1 refills | Status: DC

## 2022-08-11 MED ORDER — FLUOXETINE HCL 20 MG PO CAPS: 20.0000 mg | ORAL_CAPSULE | Freq: Every morning | ORAL | 1 refills | Status: DC

## 2022-08-11 MED ORDER — AMLODIPINE BESYLATE 10 MG PO TABS: 10.0000 mg | ORAL_TABLET | Freq: Every day | ORAL | 1 refills | Status: DC

## 2022-08-11 NOTE — Telephone Encounter (Signed)
Requested Prescriptions  Pending Prescriptions Disp Refills   amLODipine (NORVASC) 10 MG tablet 90 tablet 1    Sig: Take 1 tablet (10 mg total) by mouth daily.     Cardiovascular: Calcium Channel Blockers 2 Passed - 08/10/2022  3:41 PM      Passed - Last BP in normal range    BP Readings from Last 1 Encounters:  06/15/22 137/83         Passed - Last Heart Rate in normal range    Pulse Readings from Last 1 Encounters:  06/15/22 79         Passed - Valid encounter within last 6 months    Recent Outpatient Visits           1 month ago Colon cancer screening   [place], Michelle P, NP   5 months ago Colon cancer screening   [place] Kerin Perna, NP   1 year ago GAD (generalized anxiety disorder)   [place], [place], NP   2 years ago Essential hypertension   [place], Michelle P, NP   3 years ago Encounter to establish care   [place] Kerin Perna, NP       Future Appointments             In 4 months [place], Milford Cage, NP [facility]             lisinopril-hydrochlorothiazide (ZESTORETIC) 20-25 MG tablet 90 tablet 1    Sig: Take 1 tablet by mouth every morning.     Cardiovascular:  ACEI + Diuretic Combos Failed - 08/10/2022  3:41 PM      Failed - Na in normal range and within 180 days    Sodium  Date Value Ref Range Status  10/10/2021 136 135 - 145 mmol/L Final  09/23/2020 136 134 - 144 mmol/L Final         Failed - K in normal range and within 180 days    Potassium  Date Value Ref Range Status  10/10/2021 4.4 3.5 - 5.1 mmol/L Final         Failed - Cr in normal range and within 180 days    Creatinine, Ser  Date Value Ref Range Status  10/10/2021 1.37 (H) 0.61 - 1.24 mg/dL Final         Failed - eGFR is 30 or above and within 180 days    GFR calc Af Amer   Date Value Ref Range Status  09/23/2020 88 >59 mL/min/1.73 Final    Comment:    **In accordance with recommendations from the NKF-ASN Task force,**   [facility] is in the process of updating its eGFR calculation to the   2021 CKD-EPI creatinine equation that estimates kidney function   without a race variable.    GFR, Estimated  Date Value Ref Range Status  10/10/2021 >60 >60 mL/min Final    Comment:    (NOTE) Calculated using the CKD-EPI Creatinine Equation (2021)          Passed - Patient is not pregnant      Passed - Last BP in normal range    BP Readings from Last 1 Encounters:  06/15/22 137/83         Passed - Valid encounter within last 6 months    Recent Outpatient Visits  1 month ago Colon cancer screening   [place] Kerin Perna, NP   5 months ago Colon cancer screening   [place] Kerin Perna, NP   1 year ago GAD (generalized anxiety disorder)   [place] Kerin Perna, NP   2 years ago Essential hypertension   [place], Michelle P, NP   3 years ago Encounter to establish care   [place] Kerin Perna, NP       Future Appointments             In 4 months Edwards, Milford Cage, NP [facility]             FLUoxetine (PROZAC) 20 MG capsule 90 capsule 1    Sig: Take 1 capsule (20 mg total) by mouth every morning.     Psychiatry:  Antidepressants - SSRI Passed - 08/10/2022  3:41 PM      Passed - Valid encounter within last 6 months    Recent Outpatient Visits           1 month ago Colon cancer screening   [place], Michelle P, NP   5 months ago Colon cancer screening   [place], Michelle P, NP   1 year ago GAD (generalized anxiety disorder)   [place],  [place], NP   2 years ago Essential hypertension   [place], Michelle P, NP   3 years ago Encounter to establish care   [place] Kerin Perna, NP       Future Appointments             In 4 months Edwards, Milford Cage, NP [facility]             atorvastatin (LIPITOR) 10 MG tablet 90 tablet 3    Sig: Take 1 tablet (10 mg total) by mouth daily.     Cardiovascular:  Antilipid - Statins Failed - 08/10/2022  3:41 PM      Failed - Lipid Panel in normal range within the last 12 months    Cholesterol, Total  Date Value Ref Range Status  09/23/2020 130 100 - 199 mg/dL Final   LDL Chol Calc (NIH)  Date Value Ref Range Status  09/23/2020 65 0 - 99 mg/dL Final   HDL  Date Value Ref Range Status  09/23/2020 53 >39 mg/dL Final   Triglycerides  Date Value Ref Range Status  09/23/2020 57 0 - 149 mg/dL Final         Passed - Patient is not pregnant      Passed - Valid encounter within last 12 months    Recent Outpatient Visits           1 month ago Colon cancer screening   [place], Michelle P, NP   5 months ago Colon cancer screening   [place], Michelle P, NP   1 year ago GAD (generalized anxiety disorder)   [place], [place], NP   2 years ago Essential hypertension   [place] Kerin Perna, NP   3 years ago Encounter to establish care   [place]  Kerin Perna, NP       Future Appointments             In 4 months Oletta Lamas, Milford Cage, NP [place]

## 2022-08-11 NOTE — Telephone Encounter (Signed)
Requested medication (s) are due for refill today: routing for review  Requested medication (s) are on the active medication list: yes  Last refill:  07/30/20  Future visit scheduled: yes  Notes to clinic:  Patient not taking:   Reported on 02/22/2022, medication is till on current list. Routing for review     Requested Prescriptions  Pending Prescriptions Disp Refills   atorvastatin (LIPITOR) 10 MG tablet 90 tablet 3    Sig: Take 1 tablet (10 mg total) by mouth daily.     Cardiovascular:  Antilipid - Statins Failed - 08/10/2022  3:41 PM      Failed - Lipid Panel in normal range within the last 12 months    Cholesterol, Total  Date Value Ref Range Status  09/23/2020 130 100 - 199 mg/dL Final   LDL Chol Calc (NIH)  Date Value Ref Range Status  09/23/2020 65 0 - 99 mg/dL Final   HDL  Date Value Ref Range Status  09/23/2020 53 >39 mg/dL Final   Triglycerides  Date Value Ref Range Status  09/23/2020 57 0 - 149 mg/dL Final         Passed - Patient is not pregnant      Passed - Valid encounter within last 12 months    Recent Outpatient Visits           1 month ago Colon cancer screening   [place], Michelle P, NP   5 months ago Colon cancer screening   [place], Michelle P, NP   1 year ago GAD (generalized anxiety disorder)   [place], [place], NP   2 years ago Essential hypertension   [place], Michelle P, NP   3 years ago Encounter to establish care   [place] Kerin Perna, NP       Future Appointments             In 4 months Edwards, Milford Cage, NP [facility]            Signed Prescriptions Disp Refills   amLODipine (NORVASC) 10 MG tablet 90 tablet 1    Sig: Take 1 tablet (10 mg total) by mouth daily.     Cardiovascular: Calcium Channel Blockers 2 Passed -  08/10/2022  3:41 PM      Passed - Last BP in normal range    BP Readings from Last 1 Encounters:  06/15/22 137/83         Passed - Last Heart Rate in normal range    Pulse Readings from Last 1 Encounters:  06/15/22 79         Passed - Valid encounter within last 6 months    Recent Outpatient Visits           1 month ago Colon cancer screening   [place], Michelle P, NP   5 months ago Colon cancer screening   [place] Kerin Perna, NP   1 year ago GAD (generalized anxiety disorder)   [facility] Kerin Perna, NP   2 years ago Essential hypertension   [place], [place], NP   3 years ago Encounter to establish care   [place] Kerin Perna, NP       Future Appointments  In 4 months Kerin Perna, NP [facility]             lisinopril-hydrochlorothiazide (ZESTORETIC) 20-25 MG tablet 90 tablet 1    Sig: Take 1 tablet by mouth every morning.     Cardiovascular:  ACEI + Diuretic Combos Failed - 08/10/2022  3:41 PM      Failed - Na in normal range and within 180 days    Sodium  Date Value Ref Range Status  10/10/2021 136 135 - 145 mmol/L Final  09/23/2020 136 134 - 144 mmol/L Final         Failed - K in normal range and within 180 days    Potassium  Date Value Ref Range Status  10/10/2021 4.4 3.5 - 5.1 mmol/L Final         Failed - Cr in normal range and within 180 days    Creatinine, Ser  Date Value Ref Range Status  10/10/2021 1.37 (H) 0.61 - 1.24 mg/dL Final         Failed - eGFR is 30 or above and within 180 days    GFR calc Af Amer  Date Value Ref Range Status  09/23/2020 88 >59 mL/min/1.73 Final    Comment:    **In accordance with recommendations from the NKF-ASN Task force,**   [facility] is in the process of updating its eGFR calculation to the    2021 CKD-EPI creatinine equation that estimates kidney function   without a race variable.    GFR, Estimated  Date Value Ref Range Status  10/10/2021 >60 >60 mL/min Final    Comment:    (NOTE) Calculated using the CKD-EPI Creatinine Equation (2021)          Passed - Patient is not pregnant      Passed - Last BP in normal range    BP Readings from Last 1 Encounters:  06/15/22 137/83         Passed - Valid encounter within last 6 months    Recent Outpatient Visits           1 month ago Colon cancer screening   [place], Michelle P, NP   5 months ago Colon cancer screening   [place] Kerin Perna, NP   1 year ago GAD (generalized anxiety disorder)   [place], [place], NP   2 years ago Essential hypertension   [place], Michelle P, NP   3 years ago Encounter to establish care   [place], Michelle P, NP       Future Appointments             In 4 months [place], Milford Cage, NP [facility]             FLUoxetine (PROZAC) 20 MG capsule 90 capsule 1    Sig: Take 1 capsule (20 mg total) by mouth every morning.     Psychiatry:  Antidepressants - SSRI Passed - 08/10/2022  3:41 PM      Passed - Valid encounter within last 6 months    Recent Outpatient Visits           1 month ago Colon cancer screening   [place] Kerin Perna, NP   5 months ago Colon cancer screening   [place] Juluis Mire  P, NP   1 year ago GAD (generalized anxiety disorder)   [place] [facility] Kerin Perna, NP   2 years ago Essential hypertension   [place], [place], NP   3 years ago Encounter to establish care   [place] Kerin Perna, NP        Future Appointments             In 4 months Oletta Lamas, Milford Cage, NP [place]

## 2022-08-21 NOTE — Telephone Encounter (Signed)
Will forwards to provider. Pt last Lipid panel was 09/23/2020

## 2022-10-25 ENCOUNTER — Encounter (INDEPENDENT_AMBULATORY_CARE_PROVIDER_SITE_OTHER): Payer: Self-pay

## 2022-12-13 ENCOUNTER — Ambulatory Visit (INDEPENDENT_AMBULATORY_CARE_PROVIDER_SITE_OTHER): Payer: No Typology Code available for payment source | Admitting: Primary Care

## 2022-12-20 ENCOUNTER — Ambulatory Visit (INDEPENDENT_AMBULATORY_CARE_PROVIDER_SITE_OTHER): Payer: No Typology Code available for payment source | Admitting: Primary Care

## 2022-12-20 DIAGNOSIS — Z1211 Encounter for screening for malignant neoplasm of colon: Secondary | ICD-10-CM

## 2022-12-20 DIAGNOSIS — M25512 Pain in left shoulder: Secondary | ICD-10-CM

## 2022-12-20 DIAGNOSIS — F411 Generalized anxiety disorder: Secondary | ICD-10-CM

## 2022-12-20 DIAGNOSIS — Z760 Encounter for issue of repeat prescription: Secondary | ICD-10-CM | POA: Diagnosis not present

## 2022-12-20 DIAGNOSIS — I10 Essential (primary) hypertension: Secondary | ICD-10-CM

## 2022-12-20 DIAGNOSIS — E782 Mixed hyperlipidemia: Secondary | ICD-10-CM

## 2022-12-20 MED ORDER — ATORVASTATIN CALCIUM 10 MG PO TABS: 10.0000 mg | ORAL_TABLET | Freq: Every day | ORAL | 0 refills | Status: DC

## 2022-12-20 MED ORDER — LISINOPRIL-HYDROCHLOROTHIAZIDE 20-25 MG PO TABS: 1.0000 | ORAL_TABLET | Freq: Every morning | ORAL | 1 refills | Status: DC

## 2022-12-20 MED ORDER — FLUOXETINE HCL 20 MG PO CAPS
20.0000 mg | ORAL_CAPSULE | Freq: Every morning | ORAL | 1 refills | Status: DC
Start: 2022-12-20 — End: 2023-02-09

## 2022-12-20 MED ORDER — METHOCARBAMOL 500 MG PO TABS
500.0000 mg | ORAL_TABLET | Freq: Three times a day (TID) | ORAL | 1 refills | Status: DC | PRN
Start: 2022-12-20 — End: 2023-02-07

## 2022-12-20 MED ORDER — AMLODIPINE BESYLATE 10 MG PO TABS
10.0000 mg | ORAL_TABLET | Freq: Every day | ORAL | 1 refills | Status: DC
Start: 2022-12-20 — End: 2023-02-09

## 2022-12-20 NOTE — Progress Notes (Signed)
[facility]   Telephone Note  I connected with Stephen Patrick, on 12/20/2022 at 10:15 AM  by telephone and verified that I am speaking with the correct person using two identifiers.   Consent: I discussed the limitations, risks, security and privacy concerns of performing an evaluation and management service by telephone and the availability of in person appointments. I also discussed with the patient that there may be a patient responsible charge related to this service. The patient expressed understanding and agreed to proceed.   Location of Patient: Home  Location of Provider: [facility]   Persons participating in Telemedicine visit: Heron Nay,  NP  112/70 History of Present Illness: Mr. Lanz Mor is a 57 year old male voices concerns with left shoulder pain. Also, feels popping. He does enjoys playing basket ball. Patient has No headache, No chest pain, No abdominal pain - No Nausea, No new weakness tingling or numbness, No Cough - shortness of breath  Past Medical History:  Diagnosis Date   Hyperlipidemia    on meds   Hypertension    on meds   Thrombocytosis    No Known Allergies  Current Outpatient Medications on File Prior to Visit  Medication Sig Dispense Refill   amLODipine (NORVASC) 10 MG tablet Take 1 tablet (10 mg total) by mouth daily. 90 tablet 1   atorvastatin (LIPITOR) 10 MG tablet Take 1 tablet (10 mg total) by mouth daily. (Patient not taking: Reported on 02/22/2022) 90 tablet 3   cetirizine (ZYRTEC ALLERGY) 10 MG tablet Take 1 tablet (10 mg total) by mouth daily. 30 tablet 3   FLUoxetine (PROZAC) 20 MG capsule Take 1 capsule (20 mg total) by mouth every morning. 90 capsule 1   fluticasone (FLONASE) 50 MCG/ACT nasal spray Place 1 spray into both nostrils in the morning and at bedtime. (Patient not taking: Reported on 02/22/2022) 16 g 3   ibuprofen (ADVIL) 800 MG  tablet Take 1 tablet (800 mg total) by mouth every 12 (twelve) hours as needed (must take with food.). 50 tablet 0   lisinopril-hydrochlorothiazide (ZESTORETIC) 20-25 MG tablet Take 1 tablet by mouth every morning. 90 tablet 1   methocarbamol (ROBAXIN) 500 MG tablet Take 1 tablet (500 mg total) by mouth every 8 (eight) hours as needed for muscle spasms. 30 tablet 0   No current facility-administered medications on file prior to visit.    Observations/Objective: There were no vitals taken for this visit.   Assessment and Plan: Diagnoses and all orders for this visit:  Colon cancer screening -     Cologuard  Essential hypertension -     CMP14+EGFR; Future -     lisinopril-hydrochlorothiazide (ZESTORETIC) 20-25 MG tablet; Take 1 tablet by mouth every morning. -     amLODipine (NORVASC) 10 MG tablet; Take 1 tablet (10 mg total) by mouth daily.  Medication refill -     lisinopril-hydrochlorothiazide (ZESTORETIC) 20-25 MG tablet; Take 1 tablet by mouth every morning. -     amLODipine (NORVASC) 10 MG tablet; Take 1 tablet (10 mg total) by mouth daily. -     FLUoxetine (PROZAC) 20 MG capsule; Take 1 capsule (20 mg total) by mouth every morning.  GAD (generalized anxiety disorder) -     CBC with Differential/Platelet; Future -     FLUoxetine (PROZAC) 20 MG capsule; Take 1 capsule (20 mg total) by mouth every morning.  Mixed hyperlipidemia -     Lipid  panel; Future  Other orders -     atorvastatin (LIPITOR) 10 MG tablet; Take 1 tablet (10 mg total) by mouth daily. -     methocarbamol (ROBAXIN) 500 MG tablet; Take 1 tablet (500 mg total) by mouth every 8 (eight) hours as needed for muscle spasms.     Follow Up Instructions: 3 months   I discussed the assessment and treatment plan with the patient. The patient was provided an opportunity to ask questions and all were answered. The patient agreed with the plan and demonstrated an understanding of the instructions.   The patient was  advised to call back or seek an in-person evaluation if the symptoms worsen or if the condition fails to improve as anticipated.     I provided 20 minutes total of non-face-to-face time during this encounter including median intraservice time, reviewing previous notes, investigations, ordering medications, medical decision making, coordinating care and patient verbalized understanding at the end of the visit.    This note has been created with Education officer, environmental. Any transcriptional errors are unintentional.   Grayce Sessions, NP 12/20/2022, 10:15 AM

## 2022-12-22 ENCOUNTER — Other Ambulatory Visit (INDEPENDENT_AMBULATORY_CARE_PROVIDER_SITE_OTHER): Payer: No Typology Code available for payment source

## 2022-12-22 DIAGNOSIS — E782 Mixed hyperlipidemia: Secondary | ICD-10-CM

## 2022-12-22 DIAGNOSIS — I10 Essential (primary) hypertension: Secondary | ICD-10-CM

## 2022-12-22 DIAGNOSIS — F411 Generalized anxiety disorder: Secondary | ICD-10-CM

## 2022-12-23 LAB — CMP14+EGFR
ALT: 21 IU/L (ref 0–44)
AST: 39 IU/L (ref 0–40)
Albumin/Globulin Ratio: 2 (ref 1.2–2.2)
Albumin: 4.7 g/dL (ref 3.8–4.9)
Alkaline Phosphatase: 73 IU/L (ref 44–121)
BUN/Creatinine Ratio: 11 (ref 9–20)
BUN: 16 mg/dL (ref 6–24)
Bilirubin Total: 0.9 mg/dL (ref 0.0–1.2)
CO2: 25 mmol/L (ref 20–29)
Calcium: 9.6 mg/dL (ref 8.7–10.2)
Chloride: 96 mmol/L (ref 96–106)
Creatinine, Ser: 1.42 mg/dL — ABNORMAL HIGH (ref 0.76–1.27)
Globulin, Total: 2.4 g/dL (ref 1.5–4.5)
Glucose: 82 mg/dL (ref 70–99)
Potassium: 4.9 mmol/L (ref 3.5–5.2)
Sodium: 136 mmol/L (ref 134–144)
Total Protein: 7.1 g/dL (ref 6.0–8.5)
eGFR: 57 mL/min/{1.73_m2} — ABNORMAL LOW (ref >59)

## 2022-12-23 LAB — LIPID PANEL
Chol/HDL Ratio: 2.8 ratio (ref 0.0–5.0)
Cholesterol, Total: 175 mg/dL (ref 100–199)
HDL: 62 mg/dL (ref >39)
LDL Chol Calc (NIH): 102 mg/dL — ABNORMAL HIGH (ref 0–99)
Triglycerides: 59 mg/dL (ref 0–149)
VLDL Cholesterol Cal: 11 mg/dL (ref 5–40)

## 2022-12-23 LAB — CBC WITH DIFFERENTIAL/PLATELET
Basophils Absolute: 0.1 10*3/uL (ref 0.0–0.2)
Basos: 1 %
EOS (ABSOLUTE): 0.1 10*3/uL (ref 0.0–0.4)
Eos: 1 %
Hematocrit: 43.6 % (ref 37.5–51.0)
Hemoglobin: 13.9 g/dL (ref 13.0–17.7)
Immature Grans (Abs): 0 10*3/uL (ref 0.0–0.1)
Immature Granulocytes: 0 %
Lymphocytes Absolute: 2 10*3/uL (ref 0.7–3.1)
Lymphs: 28 %
MCH: 26.1 pg — ABNORMAL LOW (ref 26.6–33.0)
MCHC: 31.9 g/dL (ref 31.5–35.7)
MCV: 82 fL (ref 79–97)
Monocytes Absolute: 0.6 10*3/uL (ref 0.1–0.9)
Monocytes: 9 %
Neutrophils Absolute: 4.4 10*3/uL (ref 1.4–7.0)
Neutrophils: 61 %
Platelets: 840 10*3/uL (ref 150–450)
RBC: 5.33 x10E6/uL (ref 4.14–5.80)
RDW: 15.4 % (ref 11.6–15.4)
WBC: 7.1 10*3/uL (ref 3.4–10.8)

## 2022-12-26 ENCOUNTER — Other Ambulatory Visit (INDEPENDENT_AMBULATORY_CARE_PROVIDER_SITE_OTHER): Payer: Self-pay | Admitting: Primary Care

## 2022-12-26 DIAGNOSIS — D696 Thrombocytopenia, unspecified: Secondary | ICD-10-CM

## 2023-01-10 LAB — COLOGUARD

## 2023-01-28 LAB — COLOGUARD

## 2023-02-07 ENCOUNTER — Telehealth: Payer: Self-pay | Admitting: Primary Care

## 2023-02-07 ENCOUNTER — Other Ambulatory Visit (INDEPENDENT_AMBULATORY_CARE_PROVIDER_SITE_OTHER): Payer: Self-pay | Admitting: Primary Care

## 2023-02-07 DIAGNOSIS — F411 Generalized anxiety disorder: Secondary | ICD-10-CM

## 2023-02-07 DIAGNOSIS — Z760 Encounter for issue of repeat prescription: Secondary | ICD-10-CM

## 2023-02-07 DIAGNOSIS — I10 Essential (primary) hypertension: Secondary | ICD-10-CM

## 2023-02-07 LAB — COLOGUARD: COLOGUARD: NEGATIVE

## 2023-02-07 NOTE — Telephone Encounter (Signed)
Copied from CRM [id number]. Topic: Referral - Request for Referral >> Feb 07, 2023  9:08 AM Everette C wrote: Has patient seen PCP for this complaint? Yes.   *If NO, is insurance requiring patient see PCP for this issue before PCP can refer them? Referral for which specialty: Gastroenterology  Preferred provider/office: Patient has no preference  Reason for referral: patient would like a colonoscopy

## 2023-02-07 NOTE — Telephone Encounter (Signed)
Copied from CRM [id number]. Topic: General - Other >> Feb 07, 2023  9:05 AM Everette C wrote: Reason for CRM: Medication Refill - Medication: amLODipine (NORVASC) 10 MG tablet [[id number]]  atorvastatin (LIPITOR) 10 MG tablet [[id number]]  FLUoxetine (PROZAC) 20 MG capsule [[id number]]  lisinopril-hydrochlorothiazide (ZESTORETIC) 20-25 MG tablet [[id number]]  methocarbamol (ROBAXIN) 500 MG tablet [[id number]]  Has the patient contacted their pharmacy? Yes.   (Agent: If no, request that the patient contact the pharmacy for the refill. If patient does not wish to contact the pharmacy document the reason why and proceed with request.) (Agent: If yes, when and what did the pharmacy advise?)  Preferred Pharmacy (with phone number or street name): [id number] [place] [place] - [address] [facility] [address] [place] [address] Phone: [phone] Fax: [phone] Hours: Open 24 hours   Has the patient been seen for an appointment in the last year OR does the patient have an upcoming appointment? Yes.    Agent: Please be advised that RX refills may take up to 3 business days. We ask that you follow-up with your pharmacy.

## 2023-02-07 NOTE — Telephone Encounter (Signed)
Pt had cologuard on 02-01-23

## 2023-02-08 NOTE — Telephone Encounter (Signed)
Requested medication (s) are due for refill today:   Yes for all 5  Requested medication (s) are on the active medication list:   Yes for all 5  Future visit scheduled:   No    LOV 12/20/2022   Not sure why protocol indicating she has not been seen within the last 6 months because she has.     Last ordered: All medications ordered 12/20/2022 #90, 1 refill  Returned because of the Robaxin being a non delegated refill.      All labs are in date and meet protocol criteria.     Requested Prescriptions  Pending Prescriptions Disp Refills   amLODipine (NORVASC) 10 MG tablet 90 tablet 1    Sig: Take 1 tablet (10 mg total) by mouth daily.     Cardiovascular: Calcium Channel Blockers 2 Failed - 02/07/2023 11:14 AM      Failed - Valid encounter within last 6 months    Recent Outpatient Visits           1 month ago Colon cancer screening   [place] [facility] Grayce Sessions, NP   7 months ago Colon cancer screening   [place] [facility] Grayce Sessions, NP   11 months ago Colon cancer screening   [place] [facility] Grayce Sessions, NP   2 years ago GAD (generalized anxiety disorder)   [place] [facility] Grayce Sessions, NP   2 years ago Essential hypertension   [place] [facility] Grayce Sessions, NP              Passed - Last BP in normal range    BP Readings from Last 1 Encounters:  06/15/22 137/83         Passed - Last Heart Rate in normal range    Pulse Readings from Last 1 Encounters:  06/15/22 79          atorvastatin (LIPITOR) 10 MG tablet 30 tablet 0    Sig: Take 1 tablet (10 mg total) by mouth daily.     Cardiovascular:  Antilipid - Statins Failed - 02/07/2023 11:14 AM      Failed - Lipid Panel in normal range within the last 12 months    Cholesterol, Total  Date Value Ref Range Status  12/22/2022 175 100 - 199 mg/dL Final   LDL Chol Calc (NIH)   Date Value Ref Range Status  12/22/2022 102 (H) 0 - 99 mg/dL Final   HDL  Date Value Ref Range Status  12/22/2022 62 >39 mg/dL Final   Triglycerides  Date Value Ref Range Status  12/22/2022 59 0 - 149 mg/dL Final         Passed - Patient is not pregnant      Passed - Valid encounter within last 12 months    Recent Outpatient Visits           1 month ago Colon cancer screening   [place] [facility] Grayce Sessions, NP   7 months ago Colon cancer screening   [place] [facility] Grayce Sessions, NP   11 months ago Colon cancer screening   [place] [facility] Grayce Sessions, NP   2 years ago GAD (generalized anxiety disorder)   [place] [facility] Grayce Sessions, NP   2 years ago Essential hypertension    [facility] Grayce Sessions, NP  FLUoxetine (PROZAC) 20 MG capsule 90 capsule 1    Sig: Take 1 capsule (20 mg total) by mouth every morning.     Psychiatry:  Antidepressants - SSRI Failed - 02/07/2023 11:14 AM      Failed - Valid encounter within last 6 months    Recent Outpatient Visits           1 month ago Colon cancer screening   [place] [facility] Grayce Sessions, NP   7 months ago Colon cancer screening   [place] [facility] Grayce Sessions, NP   11 months ago Colon cancer screening   [place] [facility] Grayce Sessions, NP   2 years ago GAD (generalized anxiety disorder)   [place] [facility] Grayce Sessions, NP   2 years ago Essential hypertension   [place] [facility] Grayce Sessions, NP               lisinopril-hydrochlorothiazide (ZESTORETIC) 20-25 MG tablet 90 tablet 1    Sig: Take 1 tablet by mouth every morning.     Cardiovascular:  ACEI + Diuretic Combos Failed - 02/07/2023 11:14 AM       Failed - Cr in normal range and within 180 days    Creatinine, Ser  Date Value Ref Range Status  12/22/2022 1.42 (H) 0.76 - 1.27 mg/dL Final         Failed - Valid encounter within last 6 months    Recent Outpatient Visits           1 month ago Colon cancer screening   [place] [facility] Grayce Sessions, NP   7 months ago Colon cancer screening   [place] [facility] Grayce Sessions, NP   11 months ago Colon cancer screening   [place] [facility] Grayce Sessions, NP   2 years ago GAD (generalized anxiety disorder)   [place] [facility] Grayce Sessions, NP   2 years ago Essential hypertension   [place] [facility] Grayce Sessions, NP              Passed - Na in normal range and within 180 days    Sodium  Date Value Ref Range Status  12/22/2022 136 134 - 144 mmol/L Final         Passed - K in normal range and within 180 days    Potassium  Date Value Ref Range Status  12/22/2022 4.9 3.5 - 5.2 mmol/L Final         Passed - eGFR is 30 or above and within 180 days    GFR calc Af Amer  Date Value Ref Range Status  09/23/2020 88 >59 mL/min/1.73 Final    Comment:    **In accordance with recommendations from the NKF-ASN Task force,**   [facility] is in the process of updating its eGFR calculation to the   2021 CKD-EPI creatinine equation that estimates kidney function   without a race variable.    GFR, Estimated  Date Value Ref Range Status  10/10/2021 >60 >60 mL/min Final    Comment:    (NOTE) Calculated using the CKD-EPI Creatinine Equation (2021)    eGFR  Date Value Ref Range Status  12/22/2022 57 (L) >59 mL/min/1.73 Final         Passed - Patient is not pregnant      Passed - Last BP  in normal range    BP Readings from Last 1 Encounters:  06/15/22 137/83          methocarbamol (ROBAXIN) 500 MG tablet 90 tablet 1    Sig: Take 1  tablet (500 mg total) by mouth every 8 (eight) hours as needed for muscle spasms.     Not Delegated - Analgesics:  Muscle Relaxants Failed - 02/07/2023 11:14 AM      Failed - This refill cannot be delegated      Failed - Valid encounter within last 6 months    Recent Outpatient Visits           1 month ago Colon cancer screening   [place] [facility] Grayce Sessions, NP   7 months ago Colon cancer screening   [place] [facility] Grayce Sessions, NP   11 months ago Colon cancer screening   [place] [facility] Grayce Sessions, NP   2 years ago GAD (generalized anxiety disorder)   [place] [facility] Grayce Sessions, NP   2 years ago Essential hypertension   [place] [facility] Grayce Sessions, NP

## 2023-02-09 ENCOUNTER — Other Ambulatory Visit (INDEPENDENT_AMBULATORY_CARE_PROVIDER_SITE_OTHER): Payer: Self-pay

## 2023-02-09 DIAGNOSIS — F411 Generalized anxiety disorder: Secondary | ICD-10-CM

## 2023-02-09 DIAGNOSIS — I10 Essential (primary) hypertension: Secondary | ICD-10-CM

## 2023-02-09 DIAGNOSIS — Z760 Encounter for issue of repeat prescription: Secondary | ICD-10-CM

## 2023-02-09 MED ORDER — ATORVASTATIN CALCIUM 10 MG PO TABS: 10.0000 mg | ORAL_TABLET | Freq: Every day | ORAL | 1 refills | Status: DC

## 2023-02-09 MED ORDER — AMLODIPINE BESYLATE 10 MG PO TABS
10.0000 mg | ORAL_TABLET | Freq: Every day | ORAL | 1 refills | Status: DC
Start: 2023-02-09 — End: 2023-08-03

## 2023-02-09 MED ORDER — METHOCARBAMOL 500 MG PO TABS
500.0000 mg | ORAL_TABLET | Freq: Three times a day (TID) | ORAL | 1 refills | Status: DC | PRN
Start: 2023-02-09 — End: 2023-08-03

## 2023-02-09 MED ORDER — LISINOPRIL-HYDROCHLOROTHIAZIDE 20-25 MG PO TABS
1.0000 | ORAL_TABLET | Freq: Every morning | ORAL | 1 refills | Status: DC
Start: 2023-02-09 — End: 2023-08-03

## 2023-02-09 MED ORDER — FLUOXETINE HCL 20 MG PO CAPS
20.0000 mg | ORAL_CAPSULE | Freq: Every morning | ORAL | 1 refills | Status: DC
Start: 2023-02-09 — End: 2023-08-03

## 2023-03-20 ENCOUNTER — Ambulatory Visit (INDEPENDENT_AMBULATORY_CARE_PROVIDER_SITE_OTHER): Payer: No Typology Code available for payment source | Admitting: Primary Care

## 2023-03-20 ENCOUNTER — Ambulatory Visit (INDEPENDENT_AMBULATORY_CARE_PROVIDER_SITE_OTHER): Payer: Self-pay

## 2023-03-20 NOTE — Telephone Encounter (Signed)
  Chief Complaint: syncopal episode Symptoms: felt lightheaded, right hand numbness that comes and goes Frequency: last night  Pertinent Negatives: Patient denies C/P, SOB, weakness or numbness of arms legs or face Disposition: [] ED /[] Urgent Care (no appt availability in office) / [x] Appointment(In office/virtual)/ []  [place] [facility]/ [] Home Care/ [] Refused Recommended Disposition /[] [place] [facility]/ []  Follow-up with PCP Additional Notes: pt stated he has been evaluated for this 2 times before Reason for Disposition  Simple fainting is a chronic symptom (has occurred multiple times)  Answer Assessment - Initial Assessment Questions 1. ONSET: "How long were you unconscious?" (minutes) "When did it happen?"     Fainted last night  2. CONTENT: "What happened during period of unconsciousness?" (e.g., seizure activity)      no 3. MENTAL STATUS: "Alert and oriented now?" (oriented x 3 = name, month, location)      Alert and oriented  4. TRIGGER: "What do you think caused the fainting?" "What were you doing just before you fainted?"  (e.g., exercise, sudden standing up, prolonged standing)     Got lightheaded and went blank got too hot and stress 6. INJURY: "Did you sustain any injury during the fall?"      no 7. CARDIAC SYMPTOMS: "Have you had any of the following symptoms: chest pain, difficulty breathing, palpitations?"     no 8. NEUROLOGIC SYMPTOMS: "Have you had any of the following symptoms: headache, numbness, vertigo, weakness?"     No occasionally  hand numbness to right hand comes and go- having it during hear  9. GI SYMPTOMS: "Have you had any of the following symptoms: abdomen pain, vomiting, diarrhea, blood in stools?"     no 10. OTHER SYMPTOMS: "Do you have any other symptoms?"       no  Protocols used: Fainting-A-AH

## 2023-05-09 ENCOUNTER — Telehealth (INDEPENDENT_AMBULATORY_CARE_PROVIDER_SITE_OTHER): Payer: Self-pay | Admitting: Primary Care

## 2023-05-09 DIAGNOSIS — I10 Essential (primary) hypertension: Secondary | ICD-10-CM

## 2023-05-09 DIAGNOSIS — F411 Generalized anxiety disorder: Secondary | ICD-10-CM

## 2023-05-09 DIAGNOSIS — Z760 Encounter for issue of repeat prescription: Secondary | ICD-10-CM

## 2023-05-09 NOTE — Telephone Encounter (Signed)
Medication Refill - Medication:  lisinopril-hydrochlorothiazide (ZESTORETIC) 20-25 MG tablet, amLODipine (NORVASC) 10 MG tablet, atorvastatin (LIPITOR) 10 MG tablet and FLUoxetine (PROZAC) 20 MG capsule   Has the patient contacted their pharmacy? No.  Preferred Pharmacy (with phone number or street name):  [id number] [place], [place] - [address] Phone: [phone]  Fax: [phone]     Has the patient been seen for an appointment in the last year OR does the patient have an upcoming appointment? Yes.    Agent: Please be advised that RX refills may take up to 3 business days. We ask that you follow-up with your pharmacy.

## 2023-05-09 NOTE — Telephone Encounter (Signed)
[facility] called and spoke to [place], [facility] about the refill(s) requested. He says they can refill them all with pickup later this afternoon.

## 2023-06-11 ENCOUNTER — Telehealth (INDEPENDENT_AMBULATORY_CARE_PROVIDER_SITE_OTHER): Payer: Self-pay | Admitting: Primary Care

## 2023-07-30 ENCOUNTER — Ambulatory Visit (INDEPENDENT_AMBULATORY_CARE_PROVIDER_SITE_OTHER): Payer: Self-pay | Admitting: *Deleted

## 2023-07-30 ENCOUNTER — Encounter (INDEPENDENT_AMBULATORY_CARE_PROVIDER_SITE_OTHER): Payer: Self-pay | Admitting: Primary Care

## 2023-07-30 NOTE — Telephone Encounter (Signed)
  Chief Complaint: blurred vision Symptoms: patient was out of town this weekend- had blurred vision, swelling in right eye- he thinks he may have been bitten by insect.  Frequency: Saturday- last several hour and got better Pertinent Negatives: Patient denies confusion, headache, arm or leg weakness, speech problems Disposition: [] ED /[] Urgent Care (no appt availability in office) / [x] Appointment(In office/virtual)/ []  [place] [facility]/ [] Home Care/ [] Refused Recommended Disposition /[] [place] [facility]/ []  Follow-up with PCP Additional Notes: Patient states symptoms are gone- he does have appointment scheduled for Friday. Patient advised call office if symptoms should return before the appointment.   Reason for Disposition  [1] Brief (now gone) blurred vision AND [2] unexplained  Answer Assessment - Initial Assessment Questions 1. DESCRIPTION: "How has your vision changed?" (e.g., complete vision loss, blurred vision, double vision, floaters, etc.)     Blurred vision- eye swelled 2. LOCATION: "One or both eyes?" If one, ask: "Which eye?"     Right eye- possible bite 3. SEVERITY: "Can you see anything?" If Yes, ask: "What can you see?" (e.g., fine print)     Vision fine- swelling gone 4. ONSET: "When did this begin?" "Did it start suddenly or has this been gradual?"     Saturday- possible insect bite 5. PATTERN: "Does this come and go, or has it been constant since it started?"     Lasted 10 hours 6. PAIN: "Is there any pain in your eye(s)?"  (Scale 1-10; or mild, moderate, severe)   - NONE (0): No pain.   - MILD (1-3): Doesn't interfere with normal activities.   - MODERATE (4-7): Interferes with normal activities or awakens from sleep.    - SEVERE (8-10): Excruciating pain, unable to do any normal activities.     no  8. CAUSE: "What do you think is causing this visual problem?"     Possible bug bite 9. OTHER SYMPTOMS: "Do you have any other symptoms?" (e.g., confusion,  headache, arm or leg weakness, speech problems)     Changes in bite- that is better too  Protocols used: Vision Loss or Change-A-AH

## 2023-07-30 NOTE — Telephone Encounter (Signed)
Will forward to provider.  FYI

## 2023-07-30 NOTE — Telephone Encounter (Signed)
Summary: Blurred Vision Advice   Pt is calling to report that he had blurred vision two days ago and a locked jaw. Soonest appt 08/03/23. No sooner appt. Please advise     Attempted to call patient- no answer- mailbox is full- unable to leave call back message.

## 2023-08-03 ENCOUNTER — Ambulatory Visit (INDEPENDENT_AMBULATORY_CARE_PROVIDER_SITE_OTHER): Payer: No Typology Code available for payment source | Admitting: Primary Care

## 2023-08-03 ENCOUNTER — Encounter (INDEPENDENT_AMBULATORY_CARE_PROVIDER_SITE_OTHER): Payer: Self-pay | Admitting: Primary Care

## 2023-08-03 VITALS — BP 149/98 | HR 78 | Resp 16 | Ht 74.0 in | Wt 240.4 lb

## 2023-08-03 DIAGNOSIS — Z2821 Immunization not carried out because of patient refusal: Secondary | ICD-10-CM | POA: Diagnosis not present

## 2023-08-03 DIAGNOSIS — F411 Generalized anxiety disorder: Secondary | ICD-10-CM

## 2023-08-03 DIAGNOSIS — I10 Essential (primary) hypertension: Secondary | ICD-10-CM | POA: Diagnosis not present

## 2023-08-03 DIAGNOSIS — J301 Allergic rhinitis due to pollen: Secondary | ICD-10-CM | POA: Diagnosis not present

## 2023-08-03 DIAGNOSIS — E782 Mixed hyperlipidemia: Secondary | ICD-10-CM

## 2023-08-03 DIAGNOSIS — Z760 Encounter for issue of repeat prescription: Secondary | ICD-10-CM | POA: Diagnosis not present

## 2023-08-04 LAB — CBC WITH DIFFERENTIAL/PLATELET
Basophils Absolute: 0.1 10*3/uL (ref 0.0–0.2)
Basos: 1 %
EOS (ABSOLUTE): 0.2 10*3/uL (ref 0.0–0.4)
Eos: 3 %
Hematocrit: 49.1 % (ref 37.5–51.0)
Hemoglobin: 15.2 g/dL (ref 13.0–17.7)
Immature Grans (Abs): 0 10*3/uL (ref 0.0–0.1)
Immature Granulocytes: 0 %
Lymphocytes Absolute: 2.2 10*3/uL (ref 0.7–3.1)
Lymphs: 32 %
MCH: 25.8 pg — ABNORMAL LOW (ref 26.6–33.0)
MCHC: 31 g/dL — ABNORMAL LOW (ref 31.5–35.7)
MCV: 83 fL (ref 79–97)
Monocytes Absolute: 0.7 10*3/uL (ref 0.1–0.9)
Monocytes: 10 %
Neutrophils Absolute: 3.8 10*3/uL (ref 1.4–7.0)
Neutrophils: 54 %
Platelets: 826 10*3/uL (ref 150–450)
RBC: 5.9 x10E6/uL — ABNORMAL HIGH (ref 4.14–5.80)
RDW: 14.8 % (ref 11.6–15.4)
WBC: 7.1 10*3/uL (ref 3.4–10.8)

## 2023-08-04 LAB — CMP14+EGFR
ALT: 22 [IU]/L (ref 0–44)
AST: 23 [IU]/L (ref 0–40)
Albumin: 4.5 g/dL (ref 3.8–4.9)
Alkaline Phosphatase: 79 [IU]/L (ref 44–121)
BUN/Creatinine Ratio: 10 (ref 9–20)
BUN: 13 mg/dL (ref 6–24)
Bilirubin Total: 0.3 mg/dL (ref 0.0–1.2)
CO2: 26 mmol/L (ref 20–29)
Calcium: 10 mg/dL (ref 8.7–10.2)
Chloride: 99 mmol/L (ref 96–106)
Creatinine, Ser: 1.33 mg/dL — ABNORMAL HIGH (ref 0.76–1.27)
Globulin, Total: 2.4 g/dL (ref 1.5–4.5)
Glucose: 50 mg/dL — ABNORMAL LOW (ref 70–99)
Potassium: 5.1 mmol/L (ref 3.5–5.2)
Sodium: 140 mmol/L (ref 134–144)
Total Protein: 6.9 g/dL (ref 6.0–8.5)
eGFR: 62 mL/min/{1.73_m2} (ref >59)

## 2023-08-04 LAB — LIPID PANEL
Chol/HDL Ratio: 2.9 {ratio} (ref 0.0–5.0)
Cholesterol, Total: 166 mg/dL (ref 100–199)
HDL: 57 mg/dL (ref >39)
LDL Chol Calc (NIH): 89 mg/dL (ref 0–99)
Triglycerides: 112 mg/dL (ref 0–149)
VLDL Cholesterol Cal: 20 mg/dL (ref 5–40)

## 2023-08-06 ENCOUNTER — Encounter (INDEPENDENT_AMBULATORY_CARE_PROVIDER_SITE_OTHER): Payer: Self-pay | Admitting: Primary Care

## 2023-08-06 MED ORDER — AMLODIPINE BESYLATE 10 MG PO TABS: 10.0000 mg | ORAL_TABLET | Freq: Every day | ORAL | 1 refills | Status: DC

## 2023-08-06 MED ORDER — ATORVASTATIN CALCIUM 10 MG PO TABS
10.0000 mg | ORAL_TABLET | Freq: Every day | ORAL | 1 refills | Status: DC
Start: 2023-08-06 — End: 2024-04-03

## 2023-08-06 MED ORDER — FLUOXETINE HCL 20 MG PO CAPS: 20.0000 mg | ORAL_CAPSULE | Freq: Every morning | ORAL | 1 refills | Status: DC

## 2023-08-06 MED ORDER — METHOCARBAMOL 500 MG PO TABS: 500.0000 mg | ORAL_TABLET | Freq: Three times a day (TID) | ORAL | 1 refills | Status: DC | PRN

## 2023-08-06 MED ORDER — LISINOPRIL-HYDROCHLOROTHIAZIDE 20-25 MG PO TABS: 1.0000 | ORAL_TABLET | Freq: Every morning | ORAL | 1 refills | Status: DC

## 2023-08-06 MED ORDER — CETIRIZINE HCL 10 MG PO TABS
10.0000 mg | ORAL_TABLET | Freq: Every day | ORAL | 1 refills | Status: DC
Start: 2023-08-06 — End: 2023-11-23

## 2023-08-06 NOTE — Progress Notes (Signed)
[facility]  Stephen Patrick, is a 57 y.o. male  OZD:[id number]  QVZ:[id number]  DOB - [birth year]  Chief Complaint  Patient presents with   Hypertension       Subjective:   Stephen Patrick is a 57 y.o. male here today for a follow up visit. Patient has No headache, No chest pain, No abdominal pain - No Nausea, No new weakness tingling or numbness, No Cough - shortness of breath Hypertension    No problems updated.  Comprehensive ROS Pertinent positive and negative noted in HPI   No Known Allergies  Past Medical History:  Diagnosis Date   Hyperlipidemia    on meds   Hypertension    on meds   Thrombocytosis     Current Outpatient Medications on File Prior to Visit  Medication Sig Dispense Refill   fluticasone (FLONASE) 50 MCG/ACT nasal spray Place 1 spray into both nostrils in the morning and at bedtime. 16 g 3   No current facility-administered medications on file prior to visit.   Health Maintenance  Topic Date Due   Zoster (Shingles) Vaccine (1 of 2) 06/09/1983   COVID-19 Vaccine (4 - 2024-25 season) 04/15/2023   Flu Shot  11/12/2023*   Cologuard (Stool DNA test)  01/31/2026   DTaP/Tdap/Td vaccine (3 - Td or Tdap) 06/20/2026   Hepatitis C Screening  Completed   HIV Screening  Completed   HPV Vaccine  Aged Out  *Topic was postponed. The date shown is not the original due date.    Objective:   Vitals:   08/03/23 0944  BP: (!) 149/98  Pulse: 78  Resp: 16  SpO2: 100%  Weight: 240 lb 6.4 oz (109 kg)  Height: 6\' 2"  (1.88 m)   BP Readings from Last 3 Encounters:  08/03/23 (!) 149/98  06/15/22 137/83  06/14/22 112/75     Physical Exam General: No apparent distress. Eyes: Extraocular eye movements intact, pupils equal and round. Neck: Supple, trachea midline. Thyroid: No enlargement, mobile without fixation, no tenderness. Cardiovascular: Regular rhythm and rate, no murmur, normal radial pulses. Respiratory: Normal respiratory  effort, clear to auscultation. Gastrointestinal: Normal pitch active bowel sounds, nontender abdomen without distention or appreciable hepatomegaly. Musculoskeletal: Normal muscle tone, no tenderness on palpation of tibia, no excessive thoracic kyphosis. Skin: Appropriate warmth, no visible rash. Mental status: Alert, conversant, speech clear, thought logical, appropriate mood and affect, no hallucinations or delusions evident. Hematologic/lymphatic: No cervical adenopathy, no visible ecchymoses.   Assessment & Plan   Stephen Patrick was seen today for hypertension.  Diagnoses and all orders for this visit:  Influenza vaccination declined  Medication refill -     amLODipine (NORVASC) 10 MG tablet; Take 1 tablet (10 mg total) by mouth daily. -     atorvastatin (LIPITOR) 10 MG tablet; Take 1 tablet (10 mg total) by mouth daily. -     cetirizine (ZYRTEC ALLERGY) 10 MG tablet; Take 1 tablet (10 mg total) by mouth daily. -     FLUoxetine (PROZAC) 20 MG capsule; Take 1 capsule (20 mg total) by mouth every morning. -     lisinopril-hydrochlorothiazide (ZESTORETIC) 20-25 MG tablet; Take 1 tablet by mouth every morning. -     methocarbamol (ROBAXIN) 500 MG tablet; Take 1 tablet (500 mg total) by mouth every 8 (eight) hours as needed for muscle spasms.  Essential hypertension BP goal - < 130/80 Explained that having normal blood pressure is the goal and medications are helping to get to goal and maintain normal blood  pressure. DIET: Limit salt intake, read nutrition labels to check salt content, limit fried and high fatty foods  Avoid using multisymptom OTC cold preparations that generally contain sudafed which can rise BP. Consult with pharmacist on best cold relief products to use for persons with HTN EXERCISE Discussed incorporating exercise such as walking - 30 minutes most days of the week and can do in 10 minute intervals    -     CBC with Differential/Platelet -     CMP14+EGFR -     amLODipine  (NORVASC) 10 MG tablet; Take 1 tablet (10 mg total) by mouth daily. -     lisinopril-hydrochlorothiazide (ZESTORETIC) 20-25 MG tablet; Take 1 tablet by mouth every morning.  Seasonal allergic rhinitis due to pollen -     cetirizine (ZYRTEC ALLERGY) 10 MG tablet; Take 1 tablet (10 mg total) by mouth daily.  GAD (generalized anxiety disorder) -     FLUoxetine (PROZAC) 20 MG capsule; Take 1 capsule (20 mg total) by mouth every morning.  Mixed hyperlipidemia -     Lipid panel -     atorvastatin (LIPITOR) 10 MG tablet; Take 1 tablet (10 mg total) by mouth daily.     Patient have been counseled extensively about nutrition and exercise. Other issues discussed during this visit include: low cholesterol diet, weight control and daily exercise, foot care, annual eye examinations at Ophthalmology, importance of adherence with medications and regular follow-up. We also discussed long term complications of uncontrolled diabetes and hypertension.   Return in about 3 months (around 11/01/2023) for medical conditions.  The patient was given clear instructions to go to ER or return to medical center if symptoms don't improve, worsen or new problems develop. The patient verbalized understanding. The patient was told to call to get lab results if they haven't heard anything in the next week.   This note has been created with Education officer, environmental. Any transcriptional errors are unintentional.   Grayce Sessions, NP 08/06/2023, 2:56 PM

## 2023-08-07 ENCOUNTER — Telehealth: Payer: Self-pay | Admitting: *Deleted

## 2023-08-07 NOTE — Telephone Encounter (Signed)
  Chief Complaint: results Symptoms: na Frequency: na Pertinent Negatives: Patient denies na Disposition: [] ED /[] Urgent Care (no appt availability in office) / [] Appointment(In office/virtual)/ []  [place] [facility]/ [] Home Care/ [] Refused Recommended Disposition /[] [place] [facility]/ []  Follow-up with PCP Additional:  Result note read to pt,verbalizes understanding.  Last referral to oncology was 12/26/22. States he did not receive call. As not followed up.

## 2023-08-30 ENCOUNTER — Telehealth (INDEPENDENT_AMBULATORY_CARE_PROVIDER_SITE_OTHER): Payer: Self-pay | Admitting: Primary Care

## 2023-08-30 NOTE — Telephone Encounter (Signed)
Pt is calling in because he is inquiring about a referral that Marcelino Duster was supposed to put in for him. Please follow up with pt.

## 2023-08-31 ENCOUNTER — Telehealth (INDEPENDENT_AMBULATORY_CARE_PROVIDER_SITE_OTHER): Payer: Self-pay | Admitting: Primary Care

## 2023-08-31 NOTE — Telephone Encounter (Signed)
Pt came in with questions about a referral. Pt was given information to where the refferal was sent so that he can follow up and set an atp with them.

## 2023-10-20 ENCOUNTER — Ambulatory Visit
Admission: EM | Admit: 2023-10-20 | Discharge: 2023-10-20 | Disposition: A | Discharge status: Home / Self Care | Attending: Family Medicine | Admitting: Family Medicine

## 2023-10-20 DIAGNOSIS — Z113 Encounter for screening for infections with a predominantly sexual mode of transmission: Secondary | ICD-10-CM

## 2023-10-20 NOTE — ED Provider Notes (Signed)
 [facility]    CSN: [id number] Arrival date & time: 10/20/23  1217      History   Chief Complaint Chief Complaint  Patient presents with   Exposure to STD    HPI Stephen Patrick is a 58 y.o. male presents for STD testing.  Patient denies any symptoms including penile discharge, testicular pain or swelling, dysuria, fevers.  No known exposure.  No other concerns at this time.   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 Authorizing Provider  amLODipine (NORVASC) 10 MG tablet Take 1 tablet (10 mg total) by mouth daily. 08/06/23   Grayce Sessions, NP  atorvastatin (LIPITOR) 10 MG tablet Take 1 tablet (10 mg total) by mouth daily. 08/06/23   Grayce Sessions, NP  cetirizine (ZYRTEC ALLERGY) 10 MG tablet Take 1 tablet (10 mg total) by mouth daily. 08/06/23   Grayce Sessions, NP  FLUoxetine (PROZAC) 20 MG capsule Take 1 capsule (20 mg total) by mouth every morning. 08/06/23   Grayce Sessions, NP  fluticasone (FLONASE) 50 MCG/ACT nasal spray Place 1 spray into both nostrils in the morning and at bedtime. 12/31/20   Particia Nearing, PA-C  lisinopril-hydrochlorothiazide (ZESTORETIC) 20-25 MG tablet Take 1 tablet by mouth every morning. 08/06/23   Grayce Sessions, NP  methocarbamol (ROBAXIN) 500 MG tablet Take 1 tablet (500 mg total) by mouth every 8 (eight) hours as needed for muscle spasms. 08/06/23   Grayce Sessions, NP    Family History Family History  Problem Relation Age of Onset   Colon  polyps Mother 64   Colon polyps Father    Colon cancer Father 58   Esophageal cancer Neg Hx    Rectal cancer Neg Hx    Stomach cancer Neg Hx     Social History Social History   Tobacco Use   Smoking status: Some Days    Types: Cigarettes   Smokeless tobacco: Never  Vaping Use   Vaping status: Never Used  Substance Use Topics   Alcohol use: No   Drug use: No     Allergies   Patient has no known allergies.   Review of Systems Review of Systems  Genitourinary:        STD testing     Physical Exam Triage Vital Signs ED Triage Vitals  Encounter Vitals Group     BP 10/20/23 1309 133/86     Systolic BP Percentile --      Diastolic BP Percentile --      Pulse Rate 10/20/23 1309 61     Resp 10/20/23 1309 17     Temp 10/20/23 1309 98.6 F (37 C)     Temp Source 10/20/23 1309 Oral     SpO2 10/20/23 1309 94 %     Weight --      Height --      Head Circumference --      Peak Flow --  Pain Score 10/20/23 1308 0     Pain Loc --      Pain Education --      Exclude from Growth Chart --    No data found.  Updated Vital Signs BP 133/86 (BP Location: Left Arm)   Pulse 61   Temp 98.6 F (37 C) (Oral)   Resp 17   SpO2 94%   Visual Acuity Right Eye Distance:   Left Eye Distance:   Bilateral Distance:    Right Eye Near:   Left Eye Near:    Bilateral Near:     Physical Exam Vitals and nursing note reviewed.  Constitutional:      Appearance: Normal appearance.  HENT:     Head: Normocephalic and atraumatic.  Eyes:     Pupils: Pupils are equal, round, and reactive to light.  Cardiovascular:     Rate and Rhythm: Normal rate.  Pulmonary:     Effort: Pulmonary effort is normal.  Skin:    General: Skin is warm and dry.  Neurological:     General: No focal deficit present.     Mental Status: He is alert and oriented to person, place, and time.  Psychiatric:        Mood and Affect: Mood normal.        Behavior: Behavior normal.      UC Treatments /  Results  Labs (all labs ordered are listed, but only abnormal results are displayed) Labs Reviewed  CYTOLOGY, (ORAL, ANAL, URETHRAL) ANCILLARY ONLY    EKG   Radiology No results found.  Procedures Procedures (including critical care time)  Medications Ordered in UC Medications - No data to display  Initial Impression / Assessment and Plan / UC Course  I have reviewed the triage vital signs and the nursing notes.  Pertinent labs & imaging results that were available during my care of the patient were reviewed by me and considered in my medical decision making (see chart for details).     STD testing is ordered and will contact for any positive results.  Patient to follow-up as needed. Final Clinical Impressions(s) / UC Diagnoses   Final diagnoses:  Screening examination for STD (sexually transmitted disease)     Discharge Instructions      The clinic will contact you with results of the testing done today if positive.  Follow-up as needed.   ED Prescriptions   None    PDMP not reviewed this encounter.   Radford Pax, NP 10/20/23 1343

## 2023-10-20 NOTE — Discharge Instructions (Addendum)
The clinic will contact you with results of the testing done today if positive Follow-up as needed

## 2023-10-20 NOTE — ED Triage Notes (Signed)
 Pt presents for STD testing.   Pt denies sxs. Pt states his partner let him know she was having discharge.

## 2023-10-22 LAB — CYTOLOGY, (ORAL, ANAL, URETHRAL) ANCILLARY ONLY
Chlamydia: NEGATIVE
Comment: NEGATIVE
Comment: NEGATIVE
Comment: NORMAL
Neisseria Gonorrhea: NEGATIVE
Trichomonas: POSITIVE — AB

## 2023-10-23 ENCOUNTER — Telehealth (HOSPITAL_COMMUNITY): Payer: Self-pay

## 2023-10-23 MED ORDER — METRONIDAZOLE 500 MG PO TABS: 2000.0000 mg | ORAL_TABLET | Freq: Once | ORAL | 0 refills | Status: AC

## 2023-10-23 NOTE — Telephone Encounter (Signed)
 Per protocol, pt requires tx with metronidazole. Attempted to reach patient x1. VM full.  Rx sent to pharmacy on file.

## 2023-11-05 ENCOUNTER — Ambulatory Visit: Admitting: Physician Assistant

## 2023-11-23 ENCOUNTER — Encounter: Payer: Self-pay | Admitting: Physician Assistant

## 2023-11-23 ENCOUNTER — Ambulatory Visit: Admitting: Physician Assistant

## 2023-11-23 ENCOUNTER — Other Ambulatory Visit: Payer: Self-pay | Admitting: Physician Assistant

## 2023-11-23 VITALS — BP 125/72 | HR 64 | Ht 74.0 in | Wt 237.0 lb

## 2023-11-23 DIAGNOSIS — I10 Essential (primary) hypertension: Secondary | ICD-10-CM

## 2023-11-23 DIAGNOSIS — F419 Anxiety disorder, unspecified: Secondary | ICD-10-CM

## 2023-11-23 DIAGNOSIS — Z125 Encounter for screening for malignant neoplasm of prostate: Secondary | ICD-10-CM

## 2023-11-23 DIAGNOSIS — D473 Essential (hemorrhagic) thrombocythemia: Secondary | ICD-10-CM | POA: Diagnosis not present

## 2023-11-23 DIAGNOSIS — Z1211 Encounter for screening for malignant neoplasm of colon: Secondary | ICD-10-CM

## 2023-11-23 DIAGNOSIS — E782 Mixed hyperlipidemia: Secondary | ICD-10-CM

## 2023-11-23 LAB — CBC WITH DIFFERENTIAL/PLATELET
Basophils Absolute: 0.1 10*3/uL (ref 0.0–0.1)
Basophils Relative: 0.6 % (ref 0.0–3.0)
Eosinophils Absolute: 0.1 10*3/uL (ref 0.0–0.7)
Eosinophils Relative: 1.4 % (ref 0.0–5.0)
HCT: 44.9 % (ref 39.0–52.0)
Hemoglobin: 14.9 g/dL (ref 13.0–17.0)
Lymphocytes Relative: 22.5 % (ref 12.0–46.0)
Lymphs Abs: 1.9 10*3/uL (ref 0.7–4.0)
MCHC: 33.1 g/dL (ref 30.0–36.0)
MCV: 80.1 fl (ref 78.0–100.0)
Monocytes Absolute: 0.8 10*3/uL (ref 0.1–1.0)
Monocytes Relative: 9.3 % (ref 3.0–12.0)
Neutro Abs: 5.6 10*3/uL (ref 1.4–7.7)
Neutrophils Relative %: 66.2 % (ref 43.0–77.0)
Platelets: 930 10*3/uL — ABNORMAL HIGH (ref 150.0–400.0)
RBC: 5.61 Mil/uL (ref 4.22–5.81)
RDW: 15.1 % (ref 11.5–15.5)
WBC: 8.4 10*3/uL (ref 4.0–10.5)

## 2023-11-23 LAB — COMPREHENSIVE METABOLIC PANEL WITH GFR
ALT: 22 U/L (ref 0–53)
AST: 24 U/L (ref 0–37)
Albumin: 5.2 g/dL (ref 3.5–5.2)
Alkaline Phosphatase: 72 U/L (ref 39–117)
BUN: 15 mg/dL (ref 6–23)
CO2: 33 meq/L — ABNORMAL HIGH (ref 19–32)
Calcium: 10 mg/dL (ref 8.4–10.5)
Chloride: 94 meq/L — ABNORMAL LOW (ref 96–112)
Creatinine, Ser: 1.39 mg/dL (ref 0.40–1.50)
GFR: 55.51 mL/min — ABNORMAL LOW (ref >60.00)
Glucose, Bld: 91 mg/dL (ref 70–99)
Potassium: 4.6 meq/L (ref 3.5–5.1)
Sodium: 136 meq/L (ref 135–145)
Total Bilirubin: 1.1 mg/dL (ref 0.2–1.2)
Total Protein: 7.6 g/dL (ref 6.0–8.3)

## 2023-11-23 LAB — PSA: PSA: 0.54 ng/mL (ref 0.10–4.00)

## 2023-11-23 NOTE — Assessment & Plan Note (Signed)
 Reviewed labs well-controlled with atorvastatin 10 mg

## 2023-11-23 NOTE — Assessment & Plan Note (Signed)
 Patient was evaluated by oncology back in 2020 and it was only recommended that he take aspirin. Recommended he restart this today, repeat CBC Pending CBC refer to hematology

## 2023-11-23 NOTE — Assessment & Plan Note (Addendum)
 Managed with Prozac 20 mg.  Reports history of OCD unsure if formally diagnosed.  Recommended therapy and to look into options with their insurance

## 2023-11-23 NOTE — Assessment & Plan Note (Signed)
 Chronic, stable well-controlled.  Managed with amlodipine 10 mg, lisinopril 20 mg, hydrochlorothiazide 25 mg. Ordered CMP Follow-up 6 months

## 2023-11-23 NOTE — Progress Notes (Signed)
 New patient visit   Patient: Stephen Patrick   DOB: Dec 29, 1965   58 y.o. Male  MRN: [id number] Visit Date: 11/23/2023  Today's healthcare provider: Alfredia Ferguson, PA-C   Cc. New patient  Subjective    Stephen Patrick is a 58 y.o. male who presents today as a new patient to establish care.   Discussed the use of AI scribe software for clinical note transcription with the patient, who gave verbal consent to proceed.  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 He is particularly concerned about his high platelet count, which has not been addressed by previous providers. He reports that he has been managing his hypertension with amlodipine and lisinopril-hydrochlorothiazide, and his anxiety and OCD with Prozac. He has not been taking aspirin, which was previously recommended for his high platelets. He quit smoking about six months ago after smoking off and on for about five years.      Past Medical History:  Diagnosis Date   Anxiety    Hyperlipidemia    on meds   Hypertension    on meds   Thrombocytosis    Past Surgical History:  Procedure Laterality Date   WISDOM TOOTH EXTRACTION  2005   Family Status  Relation Name Status   Mother Ilb Alive   Father  Deceased   MGF  (Not Specified)   Neg Hx  (Not Specified)  No partnership data on file   Family History  Problem Relation Age of Onset   Colon polyps Mother 30       breast cancer   Cancer Mother    Colon polyps Father    Colon cancer Father 87   Diabetes Father    Cerebral aneurysm Maternal Grandfather    Esophageal cancer Neg Hx    Rectal cancer Neg Hx    Stomach cancer Neg Hx    Social History   Socioeconomic History   Marital status: Married    Spouse name: Not on file   Number of children: 4   Years of education: Not on file   Highest education  level: Some college, no degree  Occupational History   Not on file  Tobacco Use   Smoking status: Former    Current packs/day: 0.00    Average packs/day: 0.8 packs/day for 5.0 years (3.8 ttl pk-yrs)    Types: Cigarettes    Start date: 2019    Quit date: 2024    Years since quitting: 1.2   Smokeless tobacco: Never  Vaping Use   Vaping status: Never Used  Substance and Sexual Activity   Alcohol use: No   Drug use: No   Sexual activity: Yes    Birth control/protection: Condom  Other Topics Concern   Not on file  Social History Narrative   Not on file   Social Drivers of Health   Financial Resource Strain: Low Risk  (11/22/2023)   Overall Financial Resource Strain (CARDIA)    Difficulty of Paying Living Expenses: Not hard at all  Food Insecurity: No Food Insecurity (11/22/2023)   Hunger Vital Sign    Worried About Running Out of Food in the Last Year: Never true    Ran Out of Food in the Last Year: Never true  Transportation Needs: No Transportation Needs (11/22/2023)   PRAPARE - Transportation    Lack of Transportation (  Medical): No    Lack of Transportation (Non-Medical): No  Physical Activity: Unknown (11/22/2023)   Exercise Vital Sign    Days of Exercise per Week: 0 days    Minutes of Exercise per Session: Not on file  Stress: No Stress Concern Present (11/22/2023)   [employer] of Occupational Health - Occupational Stress Questionnaire    Feeling of Stress : Not at all  Social Connections: Socially Integrated (11/22/2023)   Social Connection and Isolation Panel [NHANES]    Frequency of Communication with Friends and Family: More than three times a week    Frequency of Social Gatherings with Friends and Family: Three times a week    Attends Religious Services: More than 4 times per year    Active Member of Clubs or Organizations: Yes    Attends Banker Meetings: More than 4 times per year    Marital Status: Living with partner   Outpatient Medications  Prior to Visit  Medication Sig   amLODipine (NORVASC) 10 MG tablet Take 1 tablet (10 mg total) by mouth daily.   atorvastatin (LIPITOR) 10 MG tablet Take 1 tablet (10 mg total) by mouth daily.   FLUoxetine (PROZAC) 20 MG capsule Take 1 capsule (20 mg total) by mouth every morning.   lisinopril-hydrochlorothiazide (ZESTORETIC) 20-25 MG tablet Take 1 tablet by mouth every morning.   [DISCONTINUED] cetirizine (ZYRTEC ALLERGY) 10 MG tablet Take 1 tablet (10 mg total) by mouth daily.   [DISCONTINUED] fluticasone (FLONASE) 50 MCG/ACT nasal spray Place 1 spray into both nostrils in the morning and at bedtime.   [DISCONTINUED] methocarbamol (ROBAXIN) 500 MG tablet Take 1 tablet (500 mg total) by mouth every 8 (eight) hours as needed for muscle spasms.   No facility-administered medications prior to visit.   No Known Allergies  Immunization History  Administered Date(s) Administered   Influenza-Unspecified 06/03/2022   Moderna Sars-Covid-2 Vaccination 07/06/2020   PFIZER(Purple Top)SARS-COV-2 Vaccination 11/30/2019, 12/27/2019   Tdap 03/13/2016, 06/20/2016   Zoster, Unspecified 12/18/2020, 06/03/2022, 06/03/2022    Health Maintenance  Topic Date Due   Zoster Vaccines- Shingrix (1 of 2) 06/09/1983   COVID-19 Vaccine (4 - 2024-25 season) 04/15/2023   INFLUENZA VACCINE  03/14/2024   Fecal DNA (Cologuard)  01/31/2026   DTaP/Tdap/Td (3 - Td or Tdap) 06/20/2026   Hepatitis C Screening  Completed   HIV Screening  Completed   HPV VACCINES  Aged Out   Meningococcal B Vaccine  Aged Out    Patient Care Team: Alfredia Ferguson, PA-C as PCP - General (Physician Assistant)  Review of Systems  Constitutional:  Negative for fatigue and fever.  Respiratory:  Negative for cough and shortness of breath.   Cardiovascular:  Negative for chest pain, palpitations and leg swelling.  Neurological:  Negative for dizziness and headaches.        Objective    BP 125/72   Pulse 64   Ht 6\' 2"  (1.88 m)    Wt 237 lb (107.5 kg)   BMI 30.43 kg/m     Physical Exam Constitutional:      General: He is awake.     Appearance: He is well-developed.  HENT:     Head: Normocephalic.  Eyes:     Conjunctiva/sclera: Conjunctivae normal.  Cardiovascular:     Rate and Rhythm: Normal rate and regular rhythm.     Heart sounds: Normal heart sounds.  Pulmonary:     Effort: Pulmonary effort is normal.     Breath sounds: Normal breath sounds.  Skin:    General: Skin is warm.  Neurological:     Mental Status: He is alert and oriented to person, place, and time.  Psychiatric:        Attention and Perception: Attention normal.        Mood and Affect: Mood normal.        Speech: Speech normal.        Behavior: Behavior is cooperative.    Depression Screen    11/23/2023    8:17 AM 08/03/2023    9:41 AM 06/14/2022    9:53 AM 02/22/2022    9:14 AM  PHQ 2/9 Scores  PHQ - 2 Score 0 0 0 1   No results found for any visits on 11/23/23.  Assessment & Plan     Essential thrombocytosis [facility]) Assessment & Plan: Patient was evaluated by oncology back in 2020 and it was only recommended that he take aspirin. Recommended he restart this today, repeat CBC Pending CBC refer to hematology  Orders: -     CBC with Differential/Platelet -     Comprehensive metabolic panel with GFR  Essential hypertension Assessment & Plan: Chronic, stable well-controlled.  Managed with amlodipine 10 mg, lisinopril 20 mg, hydrochlorothiazide 25 mg. Ordered CMP Follow-up 6 months   Prostate cancer screening -     PSA  Colon cancer screening -     Ambulatory referral to Gastroenterology  Mixed hyperlipidemia Assessment & Plan: Reviewed labs well-controlled with atorvastatin 10 mg   Anxiety Assessment & Plan: Managed with Prozac 20 mg.  Reports history of OCD unsure if formally diagnosed.  Recommended therapy and to look into options with their insurance    Return in about 6 months (around 05/24/2024) for CPE.       Alfredia Ferguson, PA-C  [facility] [phone] (phone) [phone] (fax)  [facility]

## 2023-12-01 ENCOUNTER — Other Ambulatory Visit (INDEPENDENT_AMBULATORY_CARE_PROVIDER_SITE_OTHER): Payer: Self-pay | Admitting: Primary Care

## 2023-12-01 DIAGNOSIS — Z760 Encounter for issue of repeat prescription: Secondary | ICD-10-CM

## 2023-12-01 DIAGNOSIS — I10 Essential (primary) hypertension: Secondary | ICD-10-CM

## 2023-12-01 DIAGNOSIS — F411 Generalized anxiety disorder: Secondary | ICD-10-CM

## 2023-12-03 ENCOUNTER — Encounter: Payer: Self-pay | Admitting: Hematology & Oncology

## 2023-12-03 NOTE — Telephone Encounter (Signed)
 Too soon for refill, last refill 08/06/23 for 90 ans 1 refill.  Requested Prescriptions  Pending Prescriptions Disp Refills   lisinopril -hydrochlorothiazide  (ZESTORETIC ) 20-25 MG tablet [Pharmacy Med Name: LISINOPRIL -HCTZ 20-25 MG TAB] 90 tablet 1    Sig: TAKE 1 TABLET BY MOUTH EVERY DAY IN THE MORNING     Cardiovascular:  ACEI + Diuretic Combos Passed - 12/03/2023  1:50 PM      Passed - Na in normal range and within 180 days    Sodium  Date Value Ref Range Status  11/23/2023 136 135 - 145 mEq/L Final  08/03/2023 140 134 - 144 mmol/L Final         Passed - K in normal range and within 180 days    Potassium  Date Value Ref Range Status  11/23/2023 4.6 3.5 - 5.1 mEq/L Final         Passed - Cr in normal range and within 180 days    Creatinine, Ser  Date Value Ref Range Status  11/23/2023 1.39 0.40 - 1.50 mg/dL Final         Passed - eGFR is 30 or above and within 180 days    GFR calc Af Amer  Date Value Ref Range Status  09/23/2020 88 >59 mL/min/1.73 Final    Comment:    **In accordance with recommendations from the NKF-ASN Task force,**   [facility] is in the process of updating its eGFR calculation to the   2021 CKD-EPI creatinine equation that estimates kidney function   without a race variable.    GFR, Estimated  Date Value Ref Range Status  10/10/2021 >60 >60 mL/min Final    Comment:    (NOTE) Calculated using the CKD-EPI Creatinine Equation (2021)    GFR  Date Value Ref Range Status  11/23/2023 55.51 (L) >60.00 mL/min Final    Comment:    Calculated using the CKD-EPI Creatinine Equation (2021)   eGFR  Date Value Ref Range Status  08/03/2023 62 >59 mL/min/1.73 Final         Passed - Patient is not pregnant      Passed - Last BP in normal range    BP Readings from Last 1 Encounters:  11/23/23 125/72         Passed - Valid encounter within last 6 months    Recent Outpatient Visits           4 months ago Influenza vaccination declined   [place]  [facility] Marius Siemens, NP   11 months ago Colon cancer screening   [place] [facility] Marius Siemens, NP   1 year ago Colon cancer screening   [place] [facility] Marius Siemens, NP   1 year ago Colon cancer screening   [place] [facility] Marius Siemens, NP   3 years ago GAD (generalized anxiety disorder)   [place] [facility] Marius Siemens, NP               FLUoxetine  (PROZAC ) 20 MG capsule [Pharmacy Med Name: FLUOXETINE  HCL 20 MG CAPSULE] 90 capsule 1    Sig: TAKE 1 CAPSULE BY MOUTH EVERY DAY IN THE MORNING     Psychiatry:  Antidepressants - SSRI Passed - 12/03/2023  1:50 PM      Passed - Valid encounter within last 6 months    Recent Outpatient Visits           4 months ago Influenza  vaccination declined   [place] [facility] Marius Siemens, NP   11 months ago Colon cancer screening   [place] [facility] Marius Siemens, NP   1 year ago Colon cancer screening   [place] [facility] Marius Siemens, NP   1 year ago Colon cancer screening   [place] [facility] Marius Siemens, NP   3 years ago GAD (generalized anxiety disorder)   [place] [facility] Marius Siemens, NP

## 2024-01-11 ENCOUNTER — Encounter: Payer: Self-pay | Admitting: Physician Assistant

## 2024-01-20 ENCOUNTER — Other Ambulatory Visit (INDEPENDENT_AMBULATORY_CARE_PROVIDER_SITE_OTHER): Payer: Self-pay | Admitting: Primary Care

## 2024-01-20 DIAGNOSIS — I10 Essential (primary) hypertension: Secondary | ICD-10-CM

## 2024-01-20 DIAGNOSIS — E782 Mixed hyperlipidemia: Secondary | ICD-10-CM

## 2024-01-20 DIAGNOSIS — Z760 Encounter for issue of repeat prescription: Secondary | ICD-10-CM

## 2024-03-10 ENCOUNTER — Ambulatory Visit: Admitting: Orthopedic Surgery

## 2024-03-28 ENCOUNTER — Other Ambulatory Visit (INDEPENDENT_AMBULATORY_CARE_PROVIDER_SITE_OTHER): Payer: Self-pay | Admitting: Primary Care

## 2024-03-28 DIAGNOSIS — F411 Generalized anxiety disorder: Secondary | ICD-10-CM

## 2024-03-28 DIAGNOSIS — Z760 Encounter for issue of repeat prescription: Secondary | ICD-10-CM

## 2024-03-28 DIAGNOSIS — I10 Essential (primary) hypertension: Secondary | ICD-10-CM

## 2024-04-03 ENCOUNTER — Ambulatory Visit: Admitting: Physician Assistant

## 2024-04-03 ENCOUNTER — Encounter: Payer: Self-pay | Admitting: Physician Assistant

## 2024-04-03 VITALS — BP 128/70 | HR 80 | Ht 74.0 in | Wt 244.8 lb

## 2024-04-03 DIAGNOSIS — I10 Essential (primary) hypertension: Secondary | ICD-10-CM

## 2024-04-03 DIAGNOSIS — M25561 Pain in right knee: Secondary | ICD-10-CM

## 2024-04-03 DIAGNOSIS — E782 Mixed hyperlipidemia: Secondary | ICD-10-CM | POA: Diagnosis not present

## 2024-04-03 DIAGNOSIS — D473 Essential (hemorrhagic) thrombocythemia: Secondary | ICD-10-CM | POA: Diagnosis not present

## 2024-04-03 DIAGNOSIS — F1410 Cocaine abuse, uncomplicated: Secondary | ICD-10-CM

## 2024-04-03 DIAGNOSIS — R519 Headache, unspecified: Secondary | ICD-10-CM

## 2024-04-03 DIAGNOSIS — M25562 Pain in left knee: Secondary | ICD-10-CM

## 2024-04-03 DIAGNOSIS — Z1589 Genetic susceptibility to other disease: Secondary | ICD-10-CM | POA: Diagnosis not present

## 2024-04-03 DIAGNOSIS — F411 Generalized anxiety disorder: Secondary | ICD-10-CM

## 2024-04-03 MED ORDER — FLUOXETINE HCL 20 MG PO CAPS: 20.0000 mg | ORAL_CAPSULE | Freq: Every morning | ORAL | 1 refills | Status: AC

## 2024-04-03 MED ORDER — ATORVASTATIN CALCIUM 10 MG PO TABS: 10.0000 mg | ORAL_TABLET | Freq: Every day | ORAL | 1 refills | Status: AC

## 2024-04-03 MED ORDER — AMLODIPINE BESYLATE 10 MG PO TABS
10.0000 mg | ORAL_TABLET | Freq: Every day | ORAL | 1 refills | Status: AC
Start: 2024-04-03 — End: ?

## 2024-04-03 MED ORDER — LISINOPRIL-HYDROCHLOROTHIAZIDE 20-25 MG PO TABS: 1.0000 | ORAL_TABLET | Freq: Every morning | ORAL | 1 refills | Status: AC

## 2024-04-03 NOTE — Progress Notes (Signed)
 Established patient visit   Patient: Stephen Patrick   DOB: 1965/09/16   58 y.o. Male  MRN: [id number] Visit Date: 04/03/2024  Today's healthcare provider: Manuelita Flatness, PA-C   Cc. Follow up  Subjective    Discussed the use of AI scribe software for clinical note transcription with the patient, who gave verbal consent to proceed.  History of Present Illness   Stephen Patrick is a 58 year old male who presents with knee pain and yellowing of the eyes.  He experiences significant bilateral knee pain with a sharp, knife-like sensation during activities such as walking or sitting. There are no recent falls or injuries to the knees.  He has noticed yellowing of the eyes, which is a change from his usual appearance.  He has been experiencing headaches for the past couple of weeks, occurring at various times of the day, including morning and evening. The headaches are not severe enough to require pain medication and are not localized. Denies any patterns noted with food intake, sleep, water intake.  He recently returned from a 24-day inpatient treatment program for cocaine abuse and has been prescribed Seroquel for sleep and mood swings, which he started taking last night at a half dose. He feels calmer and more relaxed today. He reports participating in a IOP and has a psychiatrist.  Medications: Outpatient Medications Prior to Visit  Medication Sig   QUEtiapine (SEROQUEL) 50 MG tablet Take 50 mg by mouth at bedtime.   [DISCONTINUED] amLODipine  (NORVASC ) 10 MG tablet Take 1 tablet (10 mg total) by mouth daily.   [DISCONTINUED] atorvastatin  (LIPITOR) 10 MG tablet Take 1 tablet (10 mg total) by mouth daily.   [DISCONTINUED] FLUoxetine  (PROZAC ) 20 MG capsule Take 1 capsule (20 mg total) by mouth every morning.   [DISCONTINUED] lisinopril -hydrochlorothiazide  (ZESTORETIC ) 20-25 MG tablet Take 1 tablet by mouth every morning.   No facility-administered medications prior to visit.     Review of Systems  Constitutional:  Negative for fatigue and fever.  Respiratory:  Negative for cough and shortness of breath.   Cardiovascular:  Negative for chest pain, palpitations and leg swelling.  Musculoskeletal:  Positive for arthralgias.  Neurological:  Negative for dizziness and headaches.       Objective    BP 128/70   Pulse 80   Ht 6' 2 (1.88 m)   Wt 244 lb 12.8 oz (111 kg)   BMI 31.43 kg/m    Physical Exam Constitutional:      General: He is awake.     Appearance: He is well-developed.  HENT:     Head: Normocephalic.  Eyes:     Conjunctiva/sclera: Conjunctivae normal.     Pupils: Pupils are equal, round, and reactive to light.     Comments: Does not appear icteric to me  Cardiovascular:     Rate and Rhythm: Normal rate and regular rhythm.     Heart sounds: Normal heart sounds.  Pulmonary:     Effort: Pulmonary effort is normal.     Breath sounds: Normal breath sounds.  Skin:    General: Skin is warm.  Neurological:     Mental Status: He is alert and oriented to person, place, and time.  Psychiatric:        Attention and Perception: Attention normal.        Mood and Affect: Mood normal.        Speech: Speech normal.        Behavior: Behavior is cooperative.  No results found for any visits on 04/03/24.  Assessment & Plan    Essential thrombocytosis (HCC) JAK2 V617F mutation Re-referred to hematology. Pt did not make appt. -     CBC with Differential/Platelet -     Ambulatory referral to Hematology / Oncology -     Comprehensive metabolic panel with GFR  Essential hypertension -     Comprehensive metabolic panel with GFR -     amLODIPine  Besylate; Take 1 tablet (10 mg total) by mouth daily.  Dispense: 90 tablet; Refill: 1 -     Lisinopril -hydroCHLOROthiazide ; Take 1 tablet by mouth every morning.  Dispense: 90 tablet; Refill: 1  Mixed hyperlipidemia -     Atorvastatin  Calcium ; Take 1 tablet (10 mg total) by mouth daily.  Dispense:  90 tablet; Refill: 1  GAD (generalized anxiety disorder) -     FLUoxetine  HCl; Take 1 capsule (20 mg total) by mouth every morning.  Dispense: 90 capsule; Refill: 1  Cocaine use disorder, in early remission - Continue outpatient therapy   Bilateral knee pain Sharp, knife-like pain in both knees without recent trauma or increased physical activity.  - pt will monitor, consider ref to ortho vs pt  Recurrent headache Headaches for the past few weeks, occurring in the morning and evening. Not severe enough for medication, described as moving around the head. - Order blood work to assess for underlying causes - Reassess symptoms in follow-up    Return if symptoms worsen or fail to improve.       Manuelita Flatness, PA-C  [facility] [phone] (phone) [phone] (fax)  [facility]

## 2024-04-04 ENCOUNTER — Ambulatory Visit: Payer: Self-pay | Admitting: Physician Assistant

## 2024-04-04 DIAGNOSIS — N179 Acute kidney failure, unspecified: Secondary | ICD-10-CM

## 2024-04-04 LAB — COMPREHENSIVE METABOLIC PANEL WITH GFR
ALT: 35 U/L (ref 0–53)
AST: 26 U/L (ref 0–37)
Albumin: 4.5 g/dL (ref 3.5–5.2)
Alkaline Phosphatase: 84 U/L (ref 39–117)
BUN: 23 mg/dL (ref 6–23)
CO2: 29 meq/L (ref 19–32)
Calcium: 9.1 mg/dL (ref 8.4–10.5)
Chloride: 99 meq/L (ref 96–112)
Creatinine, Ser: 1.75 mg/dL — ABNORMAL HIGH (ref 0.40–1.50)
GFR: 42 mL/min — ABNORMAL LOW (ref >60.00)
Glucose, Bld: 76 mg/dL (ref 70–99)
Potassium: 4.7 meq/L (ref 3.5–5.1)
Sodium: 137 meq/L (ref 135–145)
Total Bilirubin: 0.3 mg/dL (ref 0.2–1.2)
Total Protein: 6.9 g/dL (ref 6.0–8.3)

## 2024-04-04 LAB — CBC WITH DIFFERENTIAL/PLATELET
Basophils Absolute: 0.1 K/uL (ref 0.0–0.1)
Basophils Relative: 1.3 % (ref 0.0–3.0)
Eosinophils Absolute: 0.2 K/uL (ref 0.0–0.7)
Eosinophils Relative: 2 % (ref 0.0–5.0)
HCT: 47.1 % (ref 39.0–52.0)
Hemoglobin: 15.3 g/dL (ref 13.0–17.0)
Lymphocytes Relative: 32.2 % (ref 12.0–46.0)
Lymphs Abs: 2.8 K/uL (ref 0.7–4.0)
MCHC: 32.5 g/dL (ref 30.0–36.0)
MCV: 77.1 fl — ABNORMAL LOW (ref 78.0–100.0)
Monocytes Absolute: 1.1 K/uL — ABNORMAL HIGH (ref 0.1–1.0)
Monocytes Relative: 12.6 % — ABNORMAL HIGH (ref 3.0–12.0)
Neutro Abs: 4.5 K/uL (ref 1.4–7.7)
Neutrophils Relative %: 51.9 % (ref 43.0–77.0)
Platelets: 874 K/uL — ABNORMAL HIGH (ref 150.0–400.0)
RBC: 6.11 Mil/uL — ABNORMAL HIGH (ref 4.22–5.81)
RDW: 15.4 % (ref 11.5–15.5)
WBC: 8.7 K/uL (ref 4.0–10.5)

## 2024-04-11 ENCOUNTER — Other Ambulatory Visit (INDEPENDENT_AMBULATORY_CARE_PROVIDER_SITE_OTHER)

## 2024-04-11 DIAGNOSIS — N179 Acute kidney failure, unspecified: Secondary | ICD-10-CM

## 2024-04-11 LAB — BASIC METABOLIC PANEL WITH GFR
BUN/Creatinine Ratio: 17 (calc) (ref 6–22)
BUN: 23 mg/dL (ref 7–25)
CO2: 29 mmol/L (ref 20–32)
Calcium: 9.5 mg/dL (ref 8.6–10.3)
Chloride: 100 mmol/L (ref 98–110)
Creat: 1.39 mg/dL — ABNORMAL HIGH (ref 0.70–1.30)
Glucose, Bld: 81 mg/dL (ref 65–99)
Potassium: 4.6 mmol/L (ref 3.5–5.3)
Sodium: 139 mmol/L (ref 135–146)
eGFR: 58 mL/min/1.73m2 — ABNORMAL LOW (ref >=60)

## 2024-04-15 ENCOUNTER — Other Ambulatory Visit: Payer: Self-pay | Admitting: Family

## 2024-04-15 ENCOUNTER — Inpatient Hospital Stay: Admitting: Family

## 2024-04-15 ENCOUNTER — Encounter: Payer: Self-pay | Admitting: Family

## 2024-04-15 ENCOUNTER — Inpatient Hospital Stay: Attending: Hematology & Oncology

## 2024-04-15 VITALS — BP 130/79 | HR 62 | Temp 97.9°F | Resp 17 | Ht 71.5 in | Wt 243.0 lb

## 2024-04-15 DIAGNOSIS — Z7964 Long term (current) use of myelosuppressive agent: Secondary | ICD-10-CM | POA: Diagnosis not present

## 2024-04-15 DIAGNOSIS — Z1589 Genetic susceptibility to other disease: Secondary | ICD-10-CM

## 2024-04-15 DIAGNOSIS — Z7982 Long term (current) use of aspirin: Secondary | ICD-10-CM

## 2024-04-15 DIAGNOSIS — D473 Essential (hemorrhagic) thrombocythemia: Secondary | ICD-10-CM | POA: Insufficient documentation

## 2024-04-15 LAB — CMP (CANCER CENTER ONLY)
ALT: 31 U/L (ref 0–44)
AST: 30 U/L (ref 15–41)
Albumin: 4.6 g/dL (ref 3.5–5.0)
Alkaline Phosphatase: 75 U/L (ref 38–126)
Anion gap: 12 (ref 5–15)
BUN: 18 mg/dL (ref 6–20)
CO2: 28 mmol/L (ref 22–32)
Calcium: 9.7 mg/dL (ref 8.9–10.3)
Chloride: 100 mmol/L (ref 98–111)
Creatinine: 1.49 mg/dL — ABNORMAL HIGH (ref 0.61–1.24)
GFR, Estimated: 54 mL/min — ABNORMAL LOW (ref >60)
Glucose, Bld: 88 mg/dL (ref 70–99)
Potassium: 4.2 mmol/L (ref 3.5–5.1)
Sodium: 140 mmol/L (ref 135–145)
Total Bilirubin: 0.6 mg/dL (ref 0.0–1.2)
Total Protein: 7.3 g/dL (ref 6.5–8.1)

## 2024-04-15 LAB — CBC WITH DIFFERENTIAL (CANCER CENTER ONLY)
Abs Immature Granulocytes: 0.04 K/uL (ref 0.00–0.07)
Basophils Absolute: 0.1 K/uL (ref 0.0–0.1)
Basophils Relative: 1 %
Eosinophils Absolute: 0.2 K/uL (ref 0.0–0.5)
Eosinophils Relative: 3 %
HCT: 48.6 % (ref 39.0–52.0)
Hemoglobin: 15.7 g/dL (ref 13.0–17.0)
Immature Granulocytes: 1 %
Lymphocytes Relative: 36 %
Lymphs Abs: 2.2 K/uL (ref 0.7–4.0)
MCH: 24.8 pg — ABNORMAL LOW (ref 26.0–34.0)
MCHC: 32.3 g/dL (ref 30.0–36.0)
MCV: 76.7 fL — ABNORMAL LOW (ref 80.0–100.0)
Monocytes Absolute: 0.7 K/uL (ref 0.1–1.0)
Monocytes Relative: 12 %
Neutro Abs: 3 K/uL (ref 1.7–7.7)
Neutrophils Relative %: 47 %
Platelet Count: 737 K/uL — ABNORMAL HIGH (ref 150–400)
RBC: 6.34 MIL/uL — ABNORMAL HIGH (ref 4.22–5.81)
RDW: 15.7 % — ABNORMAL HIGH (ref 11.5–15.5)
WBC Count: 6.1 K/uL (ref 4.0–10.5)
nRBC: 0 % (ref 0.0–0.2)

## 2024-04-15 LAB — RETICULOCYTES
Immature Retic Fract: 7 % (ref 2.3–15.9)
RBC.: 6.26 MIL/uL — ABNORMAL HIGH (ref 4.22–5.81)
Retic Count, Absolute: 52 K/uL (ref 19.0–186.0)
Retic Ct Pct: 0.8 % (ref 0.4–3.1)

## 2024-04-15 LAB — LACTATE DEHYDROGENASE: LDH: 218 U/L — ABNORMAL HIGH (ref 98–192)

## 2024-04-15 NOTE — Progress Notes (Signed)
 Hematology/Oncology Consultation   Name: Stephen Patrick      MRN: [id number]    Location: Room/bed info not found  Date: 04/15/2024 Time:9:21 AM   REFERRING PHYSICIAN:  Morna Flatness, PA-C  REASON FOR CONSULT:  Essential thrombocythemia and JAK2 V617F mutation   DIAGNOSIS:  Essential thrombocythemia JAK2 V617F mutation  HISTORY OF PRESENT ILLNESS:  Stephen Patrick is a very pleasant 58 yo African American gentleman with history of JAK2 V617F mutation and essential thrombocythemia diagnosed in August 2017.  He has been referred to hematology in the past for management but did not go through with the appointment until now.  Platelet count today is stable at 737, WBC count 6.1, diff unremarkable and Hgb 15.7/Hct 48.6%.  He denies fatigue.  He walks with his partner daily for exercise.  No history of stroke, heart attack or thrombotic event.  His maternal grandfather had history of cerebral hemorrhage.  No history of diabetes or thyroid disease.  No personal history of cancer. His mother had breast cancer.  Cologuard last year was negative. He has never had a colonoscopy but will discuss with his PCP. \ PSA in April was normal.  Only past surgical history of stitches.  No issue with frequent or recurrent infections.  No fever, chills, n/v, cough, rash, dizziness, SOB, chest pain, abdominal pain/bloating or changes in bowel or bladder habits.  He notes occasional palpitations.  No swelling in his extremities.  He has intermittent tingling and numbness in his hands more so in the left.  He has issues with stiff neck.  No falls or syncope in over a year.  No smoking, ETOH or recreational drug use.  Appetite and hydration are good. Weight is described as stable at 243 lbs.  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 He is currently unemployed.   ROS: All other 10 point review of systems is negative.   PAST MEDICAL HISTORY:   Past Medical History:  Diagnosis Date    Anxiety    Hyperlipidemia    on meds   Hypertension    on meds   Thrombocytosis     ALLERGIES: No Known Allergies    MEDICATIONS:  Current Outpatient Medications on File Prior to Visit  Medication Sig Dispense Refill   amLODipine  (NORVASC ) 10 MG tablet Take 1 tablet (10 mg total) by mouth daily. 90 tablet 1   atorvastatin  (LIPITOR) 10 MG tablet Take 1 tablet (10 mg total) by mouth daily. 90 tablet 1   FLUoxetine  (PROZAC ) 20 MG capsule Take 1 capsule (20 mg total) by mouth every morning. 90 capsule 1   lisinopril -hydrochlorothiazide  (ZESTORETIC ) 20-25 MG tablet Take 1 tablet by mouth every morning. 90 tablet 1   QUEtiapine (SEROQUEL) 50 MG tablet Take 50 mg by mouth at bedtime.     No current facility-administered medications on file prior to visit.     PAST SURGICAL HISTORY Past Surgical History:  Procedure Laterality Date   WISDOM TOOTH EXTRACTION  2005    FAMILY HISTORY: Family History  Problem Relation Age of Onset   Colon polyps Mother 80       breast cancer   Cancer Mother    Colon polyps Father    Colon cancer Father 38   Diabetes Father    Cerebral aneurysm Maternal Grandfather    Esophageal cancer Neg Hx    Rectal cancer Neg Hx    Stomach cancer Neg Hx     SOCIAL HISTORY:  reports that he quit  smoking about 20 months ago. His smoking use included cigarettes. He started smoking about 6 years ago. He has a 3.8 pack-year smoking history. He has never used smokeless tobacco. He reports that he does not currently use drugs after having used the following drugs: Cocaine. He reports that he does not drink alcohol.  PERFORMANCE STATUS: [name] performance status is 1 - Symptomatic but completely ambulatory  PHYSICAL EXAM: Most Recent Vital Signs: Blood pressure 130/79, pulse 62, temperature 97.9 F (36.6 C), temperature source Oral, resp. rate 17, height 5' 11.5 (1.816 m), weight 243 lb (110.2 kg), SpO2 100%. BP 130/79 (BP Location: Right Arm, Patient  Position: Sitting, Cuff Size: Normal)   Pulse 62   Temp 97.9 F (36.6 C) (Oral)   Resp 17   Ht 5' 11.5 (1.816 m)   Wt 243 lb (110.2 kg)   SpO2 100%   BMI 33.42 kg/m   General Appearance:    Alert, cooperative, no distress, appears stated age  Head:    Normocephalic, without obvious abnormality, atraumatic  Eyes:    PERRL, conjunctiva/corneas clear, EOM's intact, fundi    benign, both eyes             Throat:   Lips, mucosa, and tongue normal; teeth and gums normal  Neck:   Supple, symmetrical, trachea midline, no adenopathy;       thyroid:  No enlargement/tenderness/nodules; no carotid   bruit or JVD  Back:     Symmetric, no curvature, ROM normal, no CVA tenderness  Lungs:     Clear to auscultation bilaterally, respirations unlabored  Chest wall:    No tenderness or deformity  Heart:    Regular rate and rhythm, S1 and S2 normal, no murmur, rub   or gallop  Abdomen:     Soft, non-tender, bowel sounds active all four quadrants,    no masses, no organomegaly        Extremities:   Extremities normal, atraumatic, no cyanosis or edema  Pulses:   2+ and symmetric all extremities  Skin:   Skin color, texture, turgor normal, no rashes or lesions  Lymph nodes:   Cervical, supraclavicular, and axillary nodes normal  Neurologic:   CNII-XII intact. Normal strength, sensation and reflexes      throughout    LABORATORY DATA:  No results found for this or any previous visit (from the past 48 hours).    RADIOGRAPHY: No results found.     PATHOLOGY: None   ASSESSMENT/PLAN: Stephen Patrick is a very pleasant 5 yo African American gentleman with history of JAK2 V617F mutation and essential thrombocythemia diagnosed in August 2017.  We will get him started on 1 coated baby aspirin  daily as well as Hydrea 500 mg PO daily.  Follow-up in 6 weeks to assess his response.  We did discuss potential side effects of the Hydrea and mechanism of action.   All questions were answered. The patient  knows to call the clinic with any problems, questions or concerns. We can certainly see the patient much sooner if necessary.  The patient was discussed with Dr. Timmy and he is in agreement with the aforementioned.   Lauraine Pepper, NP

## 2024-04-24 ENCOUNTER — Other Ambulatory Visit: Payer: Self-pay | Admitting: Hematology & Oncology

## 2024-04-24 ENCOUNTER — Encounter: Payer: Self-pay | Admitting: Family

## 2024-04-24 MED ORDER — HYDROXYUREA 500 MG PO CAPS: 500.0000 mg | ORAL_CAPSULE | Freq: Every day | ORAL | 6 refills | Status: AC

## 2024-05-28 ENCOUNTER — Inpatient Hospital Stay: Admitting: Family

## 2024-05-28 ENCOUNTER — Inpatient Hospital Stay: Attending: Family

## 2024-06-16 ENCOUNTER — Encounter: Payer: Self-pay | Admitting: Radiology

## 2024-08-15 ENCOUNTER — Inpatient Hospital Stay

## 2024-08-15 ENCOUNTER — Inpatient Hospital Stay: Admitting: Family

## 2024-08-22 ENCOUNTER — Inpatient Hospital Stay: Admitting: Family

## 2024-08-22 ENCOUNTER — Ambulatory Visit: Admitting: Family Medicine

## 2024-08-22 ENCOUNTER — Inpatient Hospital Stay: Attending: Family

## 2024-08-25 NOTE — Progress Notes (Incomplete)
 "   New Patient Office Visit   Subjective     Patient ID: Stephen Patrick, male   DOB: 1965-10-08  Age: 59 y.o. MRN: [id number]   CC:  No chief complaint on file.     HPI Bernerd Terhune presents to establish care       Show/hide medication list[1] Past Medical History:  Diagnosis Date   Anxiety    Hyperlipidemia    on meds   Hypertension    on meds   Thrombocytosis     Past Surgical History:  Procedure Laterality Date   WISDOM TOOTH EXTRACTION  2005     Family History  Problem Relation Age of Onset   Colon polyps Mother 69       breast cancer   Cancer Mother    Colon polyps Father    Colon cancer Father 5   Diabetes Father    Cerebral aneurysm Maternal Grandfather    Esophageal cancer Neg Hx    Rectal cancer Neg Hx    Stomach cancer Neg Hx     Social History   Socioeconomic History   Marital status: Married    Spouse name: Not on file   Number of children: 4   Years of education: Not on file   Highest education level: Some college, no degree  Occupational History   Not on file  Tobacco Use   Smoking status: Former    Current packs/day: 0.00    Average packs/day: 0.8 packs/day for 5.0 years (3.8 ttl pk-yrs)    Types: Cigarettes    Start date: 2019    Quit date: 2024    Years since quitting: 2.0   Smokeless tobacco: Never  Vaping Use   Vaping status: Never Used  Substance and Sexual Activity   Alcohol use: No   Drug use: Not Currently    Types: Cocaine    Comment: 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   Sexual activity: Yes    Birth control/protection: Condom  Other Topics Concern   Not on file  Social History Narrative   Not on file   Social Drivers of Health   Tobacco Use: Medium Risk (04/15/2024)   Patient History    Smoking Tobacco Use: Former    Smokeless Tobacco Use: Never    Passive Exposure: Not on Actuary Strain: Low Risk (11/22/2023)   Overall Financial Resource Strain (CARDIA)    Difficulty of  Paying Living Expenses: Not hard at all  Food Insecurity: No Food Insecurity (04/15/2024)   Epic    Worried About Radiation Protection Practitioner of Food in the Last Year: Never true    Ran Out of Food in the Last Year: Never true  Transportation Needs: No Transportation Needs (04/15/2024)   Epic    Lack of Transportation (Medical): No    Lack of Transportation (Non-Medical): No  Physical Activity: Unknown (11/22/2023)   Exercise Vital Sign    Days of Exercise per Week: 0 days    Minutes of Exercise per Session: Not on file  Stress: No Stress Concern Present (11/22/2023)   [employer] of Occupational Health - Occupational Stress Questionnaire    Feeling of Stress : Not at all  Social Connections: Socially Integrated (11/22/2023)   Social Connection and Isolation Panel    Frequency of Communication with Friends and Family: More than three times a week    Frequency of Social Gatherings with Friends and Family: Three times a week    Attends Religious Services: More  than 4 times per year    Active Member of Clubs or Organizations: Yes    Attends Banker Meetings: More than 4 times per year    Marital Status: Living with partner  Depression (PHQ2-9): Medium Risk (04/15/2024)   Depression (PHQ2-9)    PHQ-2 Score: 7  Alcohol Screen: Low Risk (11/22/2023)   Alcohol Screen    Last Alcohol Screening Score (AUDIT): 1  Housing: Low Risk (04/15/2024)   Epic    Unable to Pay for Housing in the Last Year: No    Number of Times Moved in the Last Year: 0    Homeless in the Last Year: No  Utilities: Not At Risk (04/15/2024)   Epic    Threatened with loss of utilities: No  Health Literacy: Not on file       ROS All review of systems negative except what is listed in the HPI    Objective     There were no vitals taken for this visit.  Physical Exam     Assessment & Plan:     Problem List Items Addressed This Visit   None            No follow-ups on file.  Waddell KATHEE Mon, NP  I,Emily  Lagle,acting as a scribe for Waddell KATHEE Mon, NP.,have documented all relevant documentation on the behalf of Waddell KATHEE Mon, NP.  I, Waddell KATHEE Mon, NP, have reviewed all documentation for this visit. The documentation on 08/29/2024 for the exam, diagnosis, procedures, and orders are all accurate and complete.    [1]  Outpatient Medications Prior to Visit  Medication Sig   amLODipine  (NORVASC ) 10 MG tablet Take 1 tablet (10 mg total) by mouth daily.   atorvastatin  (LIPITOR) 10 MG tablet Take 1 tablet (10 mg total) by mouth daily.   FLUoxetine  (PROZAC ) 20 MG capsule Take 1 capsule (20 mg total) by mouth every morning.   hydroxyurea  (HYDREA ) 500 MG capsule Take 1 capsule (500 mg total) by mouth daily. May take with food to minimize GI side effects.   lisinopril -hydrochlorothiazide  (ZESTORETIC ) 20-25 MG tablet Take 1 tablet by mouth every morning.   QUEtiapine (SEROQUEL) 50 MG tablet Take 50 mg by mouth at bedtime.   No facility-administered medications prior to visit.   "

## 2024-08-29 ENCOUNTER — Ambulatory Visit: Admitting: Family Medicine

## 2024-09-05 ENCOUNTER — Inpatient Hospital Stay: Attending: Physician Assistant

## 2024-09-05 ENCOUNTER — Inpatient Hospital Stay: Admitting: Family
